# Patient Record
Sex: Female | Born: 1960 | Race: White | Hispanic: No | Smoking: Current some day smoker
Health system: Southern US, Community
[De-identification: ages and names within clinical notes are randomized; demographics above are authoritative.]

## PROBLEM LIST (undated history)

## (undated) ENCOUNTER — Emergency Department (HOSPITAL_COMMUNITY): Discharge status: Absent without leave | Payer: Self-pay

## (undated) DIAGNOSIS — F909 Attention-deficit hyperactivity disorder, unspecified type: Secondary | ICD-10-CM

## (undated) DIAGNOSIS — G43909 Migraine, unspecified, not intractable, without status migrainosus: Secondary | ICD-10-CM

## (undated) HISTORY — PX: TUBAL LIGATION: SHX77

---

## 2010-07-28 ENCOUNTER — Emergency Department (HOSPITAL_COMMUNITY)
Admission: EM | Admit: 2010-07-28 | Discharge: 2010-07-28 | Disposition: A | Discharge status: Home / Self Care | Payer: Self-pay | Attending: Emergency Medicine | Admitting: Emergency Medicine

## 2010-07-28 DIAGNOSIS — J029 Acute pharyngitis, unspecified: Secondary | ICD-10-CM | POA: Insufficient documentation

## 2010-07-28 DIAGNOSIS — R599 Enlarged lymph nodes, unspecified: Secondary | ICD-10-CM | POA: Insufficient documentation

## 2010-07-28 DIAGNOSIS — B370 Candidal stomatitis: Secondary | ICD-10-CM | POA: Insufficient documentation

## 2010-07-28 LAB — RAPID STREP SCREEN (MED CTR MEBANE ONLY): Streptococcus, Group A Screen (Direct): NEGATIVE

## 2011-10-23 ENCOUNTER — Emergency Department (HOSPITAL_COMMUNITY): Payer: No Typology Code available for payment source

## 2011-10-23 ENCOUNTER — Encounter (HOSPITAL_COMMUNITY): Payer: Self-pay | Admitting: Emergency Medicine

## 2011-10-23 ENCOUNTER — Inpatient Hospital Stay (HOSPITAL_COMMUNITY)
Admission: EM | Admit: 2011-10-23 | Discharge: 2011-10-26 | DRG: 392 | Disposition: A | Discharge status: Home / Self Care | Payer: No Typology Code available for payment source | Attending: Family Medicine | Admitting: Family Medicine

## 2011-10-23 DIAGNOSIS — N83209 Unspecified ovarian cyst, unspecified side: Secondary | ICD-10-CM | POA: Diagnosis present

## 2011-10-23 DIAGNOSIS — A09 Infectious gastroenteritis and colitis, unspecified: Principal | ICD-10-CM | POA: Diagnosis present

## 2011-10-23 DIAGNOSIS — R51 Headache: Secondary | ICD-10-CM | POA: Diagnosis present

## 2011-10-23 DIAGNOSIS — K529 Noninfective gastroenteritis and colitis, unspecified: Secondary | ICD-10-CM

## 2011-10-23 DIAGNOSIS — R112 Nausea with vomiting, unspecified: Secondary | ICD-10-CM | POA: Diagnosis present

## 2011-10-23 DIAGNOSIS — R1110 Vomiting, unspecified: Secondary | ICD-10-CM

## 2011-10-23 DIAGNOSIS — D62 Acute posthemorrhagic anemia: Secondary | ICD-10-CM | POA: Diagnosis present

## 2011-10-23 DIAGNOSIS — F17200 Nicotine dependence, unspecified, uncomplicated: Secondary | ICD-10-CM | POA: Diagnosis present

## 2011-10-23 DIAGNOSIS — F988 Other specified behavioral and emotional disorders with onset usually occurring in childhood and adolescence: Secondary | ICD-10-CM | POA: Diagnosis present

## 2011-10-23 DIAGNOSIS — A088 Other specified intestinal infections: Secondary | ICD-10-CM | POA: Diagnosis present

## 2011-10-23 DIAGNOSIS — K922 Gastrointestinal hemorrhage, unspecified: Secondary | ICD-10-CM

## 2011-10-23 HISTORY — DX: Migraine, unspecified, not intractable, without status migrainosus: G43.909

## 2011-10-23 LAB — CBC WITH DIFFERENTIAL/PLATELET
Basophils Absolute: 0 10*3/uL (ref 0.0–0.1)
Eosinophils Relative: 0 % (ref 0–5)
Lymphocytes Relative: 20 % (ref 12–46)
Lymphs Abs: 2.1 10*3/uL (ref 0.7–4.0)
MCV: 87.9 fL (ref 78.0–100.0)
Neutro Abs: 7.8 10*3/uL — ABNORMAL HIGH (ref 1.7–7.7)
Neutrophils Relative %: 73 % (ref 43–77)
Platelets: 368 10*3/uL (ref 150–400)
RBC: 5.46 MIL/uL — ABNORMAL HIGH (ref 3.87–5.11)
RDW: 14 % (ref 11.5–15.5)
WBC: 10.7 10*3/uL — ABNORMAL HIGH (ref 4.0–10.5)

## 2011-10-23 LAB — COMPREHENSIVE METABOLIC PANEL
ALT: 25 U/L (ref 0–35)
AST: 20 U/L (ref 0–37)
Alkaline Phosphatase: 75 U/L (ref 39–117)
CO2: 26 mEq/L (ref 19–32)
Chloride: 97 mEq/L (ref 96–112)
GFR calc Af Amer: >90 mL/min (ref >90)
GFR calc non Af Amer: >90 mL/min (ref >90)
Glucose, Bld: 100 mg/dL — ABNORMAL HIGH (ref 70–99)
Potassium: 4.2 mEq/L (ref 3.5–5.1)
Sodium: 137 mEq/L (ref 135–145)
Total Bilirubin: 0.3 mg/dL (ref 0.3–1.2)

## 2011-10-23 LAB — APTT: aPTT: 31 seconds (ref 24–37)

## 2011-10-23 LAB — TYPE AND SCREEN
ABO/RH(D): A POS
Antibody Screen: NEGATIVE

## 2011-10-23 MED ORDER — IOHEXOL 300 MG/ML  SOLN
100.0000 mL | Freq: Once | INTRAMUSCULAR | Status: AC | PRN
  Administered 2011-10-23: 100 mL via INTRAVENOUS

## 2011-10-23 MED ORDER — NICOTINE 21 MG/24HR TD PT24
21.0000 mg | MEDICATED_PATCH | Freq: Once | TRANSDERMAL | Status: AC
  Administered 2011-10-23: 21 mg via TRANSDERMAL
  Filled 2011-10-23: qty 1

## 2011-10-23 MED ORDER — HYDROMORPHONE HCL PF 1 MG/ML IJ SOLN
1.0000 mg | Freq: Once | INTRAMUSCULAR | Status: AC
  Administered 2011-10-23: 1 mg via INTRAVENOUS
  Filled 2011-10-23 (×2): qty 1

## 2011-10-23 MED ORDER — SODIUM CHLORIDE 0.9 % IV BOLUS (SEPSIS)
1000.0000 mL | Freq: Once | INTRAVENOUS | Status: AC
  Administered 2011-10-23: 1000 mL via INTRAVENOUS

## 2011-10-23 MED ORDER — ONDANSETRON HCL 4 MG/2ML IJ SOLN
4.0000 mg | Freq: Once | INTRAMUSCULAR | Status: AC
  Administered 2011-10-23: 4 mg via INTRAVENOUS
  Filled 2011-10-23 (×2): qty 2

## 2011-10-23 MED ORDER — HYDROMORPHONE HCL PF 1 MG/ML IJ SOLN
0.5000 mg | INTRAMUSCULAR | Status: DC | PRN
  Administered 2011-10-23: 0.5 mg via INTRAVENOUS
  Filled 2011-10-23: qty 1

## 2011-10-23 MED ORDER — HYDROMORPHONE HCL PF 1 MG/ML IJ SOLN
1.0000 mg | Freq: Once | INTRAMUSCULAR | Status: AC
  Administered 2011-10-23: 1 mg via INTRAVENOUS
  Filled 2011-10-23: qty 1

## 2011-10-23 MED ORDER — SODIUM CHLORIDE 0.9 % IJ SOLN
3.0000 mL | Freq: Two times a day (BID) | INTRAMUSCULAR | Status: DC
  Administered 2011-10-23: 3 mL via INTRAVENOUS

## 2011-10-23 MED ORDER — LORAZEPAM 2 MG/ML IJ SOLN
1.0000 mg | Freq: Once | INTRAMUSCULAR | Status: AC
  Administered 2011-10-23: 1 mg via INTRAVENOUS
  Filled 2011-10-23: qty 1

## 2011-10-23 MED ORDER — SODIUM CHLORIDE 0.9 % IV SOLN
INTRAVENOUS | Status: DC
  Administered 2011-10-23: 18:00:00 via INTRAVENOUS

## 2011-10-23 MED ORDER — SODIUM CHLORIDE 0.9 % IV SOLN
INTRAVENOUS | Status: DC
  Administered 2011-10-23 – 2011-10-24 (×2): via INTRAVENOUS
  Administered 2011-10-24: 125 mL/h via INTRAVENOUS
  Administered 2011-10-25: 19:00:00 via INTRAVENOUS

## 2011-10-23 NOTE — ED Notes (Signed)
Pt states she started having a "churning" feeling in her stomach and sharp pains and she had to go to the bathroom that started today. She reports having diarrhea at 7am and around 9am she began to see bright red blood in the toilet. Pt reports nausea and vomiting x1.

## 2011-10-23 NOTE — ED Notes (Signed)
Pt states she is still in pain and requesting more pain medication. Rates pain a 7/10

## 2011-10-23 NOTE — ED Notes (Signed)
Report given to [facility] on 5E Room 1516.

## 2011-10-23 NOTE — ED Provider Notes (Signed)
History     CSN: [id number]  Arrival date & time 10/23/11  1627   First MD Initiated Contact with Patient 10/23/11 1653      Chief Complaint  Patient presents with  . Rectal Bleeding    (Consider location/radiation/quality/duration/timing/severity/associated sxs/prior treatment) Patient is a 51 y.o. female presenting with hematochezia. The history is provided by the patient.  Rectal Bleeding    bright red blood per rectum x1 day with associated dizziness with standing. Note some abdominal cramping without fever or vomiting. No prior history of same. Denies any history of colitis in her family. No recent travel history or antibiotic use. No treatment used prior to arrival and nothing makes her symptoms worse.  History reviewed. No pertinent past medical history.  Past Surgical History  Procedure Date  . Tubal ligation     71yrs ago    Family History  Problem Relation Age of Onset  . Cancer Mother   . Cancer Father     History  Substance Use Topics  . Smoking status: Current Every Day Smoker -- 1.0 packs/day for 15 years    Types: Cigarettes  . Smokeless tobacco: Never Used  . Alcohol Use: Yes     2 beers a week    OB History    Grav Para Term Preterm Abortions TAB SAB Ect Mult Living   2 2 2       2       Review of Systems  Gastrointestinal: Positive for hematochezia.  All other systems reviewed and are negative.    Allergies  Penicillins  Home Medications   Current Outpatient Rx  Name Route Sig Dispense Refill  . AMPHETAMINE-DEXTROAMPHETAMINE 20 MG PO TABS Oral Take 20 mg by mouth daily.    . ASPIRIN-ACETAMINOPHEN-CAFFEINE 250-250-65 MG PO TABS Oral Take 1 tablet by mouth every 6 (six) hours as needed. headache      BP 121/86  Pulse 124  Temp 97.5 F (36.4 C) (Oral)  Resp 20  SpO2 97%  Physical Exam  Nursing note and vitals reviewed. Constitutional: She is oriented to person, place, and time. She appears well-developed and well-nourished.   Non-toxic appearance. No distress.  HENT:  Head: Normocephalic and atraumatic.  Eyes: Conjunctivae normal, EOM and lids are normal. Pupils are equal, round, and reactive to light.  Neck: Normal range of motion. Neck supple. No tracheal deviation present. No mass present.  Cardiovascular: Regular rhythm and normal heart sounds.  Tachycardia present.  Exam reveals no gallop.   No murmur heard. Pulmonary/Chest: Effort normal and breath sounds normal. No stridor. No respiratory distress. She has no decreased breath sounds. She has no wheezes. She has no rhonchi. She has no rales.  Abdominal: Soft. Normal appearance and bowel sounds are normal. She exhibits no distension. There is no tenderness. There is no rebound and no CVA tenderness.  Genitourinary:       Gross blood noted  Musculoskeletal: Normal range of motion. She exhibits no edema and no tenderness.  Neurological: She is alert and oriented to person, place, and time. She has normal strength. No cranial nerve deficit or sensory deficit. GCS eye subscore is 4. GCS verbal subscore is 5. GCS motor subscore is 6.  Skin: Skin is warm and dry. No abrasion and no rash noted.  Psychiatric: She has a normal mood and affect. Her speech is normal and behavior is normal.    ED Course  Procedures (including critical care time)   Labs Reviewed  CBC WITH DIFFERENTIAL  COMPREHENSIVE METABOLIC PANEL  PROTIME-INR  APTT  TYPE AND SCREEN   No results found.   No diagnosis found.    MDM  Patient to be admitted for workup of her gastrointestinal bleeding. Given IV fluids for her tachycardia        Toy Baker, MD 10/23/11 2024

## 2011-10-23 NOTE — ED Notes (Signed)
Pt requesting pain medicine for abdominal pain. Rates pain a 8/10.

## 2011-10-23 NOTE — ED Notes (Signed)
Contrast finished. Pt ready for CT.

## 2011-10-23 NOTE — H&P (Addendum)
[facility] History and Physical  Shannon Farley AOZ:[id number] DOB: Feb 14, 1960 DOA: 10/23/2011  Referring physician: ED PCP: No primary provider on file.  Specialists: None  Chief Complaint: BRBPR  HPI: Shannon Farley is a 51 y.o. female who presents with 1 day h/o LLQ abdominal pain and BRBPR.  Some abdominal cramping, no fever, no vomiting, no recent sick contacts or colitis in family.  No recent travel nor ABx use, nothing tried PTA and nothing makes it worse.  In the ED, patient found to have HGB 16.5, BRBPR was confirmed via exam, CT abdomen showed inflammation of the splenic flexure.  Hospitalist was asked to admit the patient.  Review of Systems: 12 systems reviewed and otherwise negative.  History reviewed. No pertinent past medical history. Past Surgical History  Procedure Date  . Tubal ligation     50yrs ago   Social History:  reports that she has been smoking Cigarettes.  She has a 15 pack-year smoking history. She has never used smokeless tobacco. She reports that she drinks alcohol. She reports that she does not use illicit drugs. Patient lives at home and performs all ADLs.  Allergies  Allergen Reactions  . Penicillins Swelling    Family History  Problem Relation Age of Onset  . Cancer Mother   . Cancer Father   Multiple family members have various forms of cancer, many at early ages, multiple aunts with breast cancer, uncle she thinks may have had colon cancer.  Parents with lung and pancreatic cancer.  One family member did have a history of Crohn's disease.  Prior to Admission medications   Medication Sig Start Date End Date Taking? Authorizing Provider  amphetamine-dextroamphetamine (ADDERALL) 20 MG tablet Take 20 mg by mouth daily.   Yes Historical Provider, MD  aspirin-acetaminophen-caffeine (EXCEDRIN MIGRAINE) [id number] MG per tablet Take 1 tablet by mouth every 6 (six) hours as needed. headache   Yes Historical Provider, MD   Physical Exam: Filed  Vitals:   10/23/11 1630 10/23/11 1820  BP: 121/86 147/109  Pulse: 124 114  Temp: 97.5 F (36.4 C)   TempSrc: Oral   Resp: 20 18  SpO2: 97% 98%     General:  NAD, resting comfortably in hospital bed, sedated somewhat on meds  Eyes: PEERLA EOMI  ENT: mucous membranes moist  Neck: supple w/o JVD  Cardiovascular: RRR w/o MRG  Respiratory: CTA B  Abdomen: Soft, mild TTP in LLQ, no rebound, no guarding, no organomegally, no obvious masses  Skin: no rash nor lesion  Musculoskeletal: MAE, full ROM all 4 extremities  Psychiatric: Somewhat anxious tone and affect, understandable given patients extensive family history of cancer and her concern that (however unlikely) this could be malignancy  Neurologic: drowsy but able to wake up and be AAOx3, otherwise grossly non-focal  Labs on Admission:  Basic Metabolic Panel:  Lab 10/23/11 1324  NA 137  K 4.2  CL 97  CO2 26  GLUCOSE 100*  BUN 7  CREATININE 0.65  CALCIUM 10.9*  MG --  PHOS --   Liver Function Tests:  Lab 10/23/11 1655  AST 20  ALT 25  ALKPHOS 75  BILITOT 0.3  PROT 7.9  ALBUMIN 4.4   No results found for this basename: LIPASE:5,AMYLASE:5 in the last 168 hours No results found for this basename: AMMONIA:5 in the last 168 hours CBC:  Lab 10/23/11 1655  WBC 10.7*  NEUTROABS 7.8*  HGB 16.5*  HCT 48.0*  MCV 87.9  PLT 368   Cardiac Enzymes: No  results found for this basename: CKTOTAL:5,CKMB:5,CKMBINDEX:5,TROPONINI:5 in the last 168 hours  BNP (last 3 results) No results found for this basename: PROBNP:3 in the last 8760 hours CBG: No results found for this basename: GLUCAP:5 in the last 168 hours  Radiological Exams on Admission: Ct Abdomen Pelvis W Contrast  10/23/2011  *RADIOLOGY REPORT*  Clinical Data: Rectal bleeding with abdominal cramping.  CT ABDOMEN AND PELVIS WITH CONTRAST  Technique:  Multidetector CT imaging of the abdomen and pelvis was performed following the standard protocol during  bolus administration of intravenous contrast.  Contrast:  100 ml Omnipaque-300  Comparison: None.  Findings: Liver and spleen are normal in appearance.  The stomach, duodenum, pancreas, gallbladder, adrenal glands, and right kidney are normal.  There is some cortical scarring laterally in the upper pole of the left kidney.  No abdominal aortic aneurysm.  No free fluid or lymphadenopathy in the abdomen.  There is circumferential edematous wall thickening in the splenic flexure which extends about halfway down the descending colon, but is mainly limited to the splenic flexure area.  The portal vein, splenic vein, and superior mesenteric vein are patent.  The celiac axis and superior mesenteric artery appear widely patent to their proximal segments.  Imaging through the pelvis shows no free intraperitoneal fluid.  No pelvic sidewall lymphadenopathy.  The bladder is normal.  Uterus is unremarkable.  2.7 cm cystic structure is identified in the right ovary.  1.3 cm oval fatty lesion is seen along the anteroinferior aspect of the left ovary.  This is probably a small dermoid.  The terminal ileum and the appendix are normal.  Bone windows reveal no worrisome lytic or sclerotic osseous lesions.  IMPRESSION: Mild circumferential edematous wall thickening in the splenic flexure.  Differential considerations would include inflammatory, infectious, and ischemic colitis.  2.7 cm cystic lesion in the right ovary is likely a dominant follicle.  There is a 13 mm fatty lesion in the region of the left ovary which may represent a small left ovarian dermoid.  Follow-up pelvic ultrasound could be performed in a non emergent fashion to further characterize.   Original Report Authenticated By: ERIC A. MANSELL, M.D.     EKG: No ekg obtained  Assessment/Plan Principal Problem:  *GI bleed Active Problems:  Ovarian cyst   1. GI bleed - likely lower eminating from the splenic flexure as seen on CT scan of her abdomen, given lack  of other sepsis or SIRS findings or diarrhea symptoms i doubt infectious etiology.  Ischemic and inflammatory are possibilities with inflammatory being the most likely diagnosis, neoplastic is possible but less likely.  Regardless we will evaluate for all of these with a GI consult for evaluation and likely colonoscopy. 2. 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 [facility] Pager [phone]  If 7PM-7AM, please contact night-coverage [email or web address] Password [email or web address] 10/23/2011, 9:26 PM

## 2011-10-23 NOTE — ED Notes (Signed)
Pt c/o bright red rectal bleeding since this morning. Pt states she has had multiple episodes of rectal bleeding since this morning.  Pt c/o lower abd pain and nausea. Pt denies hx of hemmorhoids. Pt also c/o dizziness. Pt a/o. Skin pale, warm, dry.

## 2011-10-24 ENCOUNTER — Encounter (HOSPITAL_COMMUNITY): Payer: Self-pay | Admitting: Gastroenterology

## 2011-10-24 DIAGNOSIS — K529 Noninfective gastroenteritis and colitis, unspecified: Secondary | ICD-10-CM

## 2011-10-24 DIAGNOSIS — R1110 Vomiting, unspecified: Secondary | ICD-10-CM

## 2011-10-24 DIAGNOSIS — K5289 Other specified noninfective gastroenteritis and colitis: Secondary | ICD-10-CM

## 2011-10-24 DIAGNOSIS — K922 Gastrointestinal hemorrhage, unspecified: Secondary | ICD-10-CM

## 2011-10-24 LAB — BASIC METABOLIC PANEL
BUN: 5 mg/dL — ABNORMAL LOW (ref 6–23)
CO2: 26 mEq/L (ref 19–32)
Chloride: 105 mEq/L (ref 96–112)
Glucose, Bld: 95 mg/dL (ref 70–99)
Potassium: 3.8 mEq/L (ref 3.5–5.1)

## 2011-10-24 MED ORDER — PROMETHAZINE HCL 25 MG/ML IJ SOLN
12.5000 mg | INTRAMUSCULAR | Status: DC | PRN
  Administered 2011-10-24 – 2011-10-26 (×5): 12.5 mg via INTRAVENOUS
  Filled 2011-10-24 (×5): qty 1

## 2011-10-24 MED ORDER — NICOTINE 21 MG/24HR TD PT24
21.0000 mg | MEDICATED_PATCH | Freq: Once | TRANSDERMAL | Status: DC
  Filled 2011-10-24: qty 1

## 2011-10-24 MED ORDER — NICOTINE 21 MG/24HR TD PT24
21.0000 mg | MEDICATED_PATCH | Freq: Once | TRANSDERMAL | Status: AC
  Administered 2011-10-24: 21 mg via TRANSDERMAL
  Filled 2011-10-24: qty 1

## 2011-10-24 MED ORDER — METRONIDAZOLE IN NACL 5-0.79 MG/ML-% IV SOLN
500.0000 mg | Freq: Three times a day (TID) | INTRAVENOUS | Status: DC
  Administered 2011-10-24 – 2011-10-26 (×7): 500 mg via INTRAVENOUS
  Filled 2011-10-24 (×8): qty 100

## 2011-10-24 MED ORDER — CIPROFLOXACIN IN D5W 400 MG/200ML IV SOLN
400.0000 mg | Freq: Two times a day (BID) | INTRAVENOUS | Status: DC
  Administered 2011-10-24 – 2011-10-26 (×5): 400 mg via INTRAVENOUS
  Filled 2011-10-24 (×6): qty 200

## 2011-10-24 MED ORDER — HYDROMORPHONE HCL PF 1 MG/ML IJ SOLN
1.0000 mg | Freq: Once | INTRAMUSCULAR | Status: AC
  Administered 2011-10-24: 1 mg via INTRAVENOUS
  Filled 2011-10-24: qty 1

## 2011-10-24 MED ORDER — HYDROMORPHONE HCL PF 1 MG/ML IJ SOLN
0.5000 mg | INTRAMUSCULAR | Status: DC | PRN
  Administered 2011-10-24 (×4): 0.5 mg via INTRAVENOUS
  Filled 2011-10-24 (×5): qty 1

## 2011-10-24 MED ORDER — ACETAMINOPHEN 325 MG PO TABS
650.0000 mg | ORAL_TABLET | Freq: Four times a day (QID) | ORAL | Status: DC | PRN
  Filled 2011-10-24: qty 2

## 2011-10-24 MED ORDER — AMPHETAMINE-DEXTROAMPHETAMINE 20 MG PO TABS
20.0000 mg | ORAL_TABLET | Freq: Every day | ORAL | Status: DC
  Administered 2011-10-24 – 2011-10-25 (×2): 20 mg via ORAL
  Filled 2011-10-24 (×2): qty 1

## 2011-10-24 MED ORDER — ASPIRIN-ACETAMINOPHEN-CAFFEINE 250-250-65 MG PO TABS
1.0000 | ORAL_TABLET | Freq: Four times a day (QID) | ORAL | Status: DC | PRN
  Administered 2011-10-24: 1 via ORAL
  Administered 2011-10-24 – 2011-10-25 (×3): 2 via ORAL
  Filled 2011-10-24 (×4): qty 2

## 2011-10-24 NOTE — Consult Note (Signed)
Referring Provider: No ref. provider found Primary Care Physician:  No primary provider on file. Primary Gastroenterologist:  None  Reason for Consultation:  GIB  HPI: Shannon Farley is a 51 y.o. female with minimal PMH who presented to [facility] ED on 10/16 with complaints of sudden onset lower abdominal pain and rectal bleeding.  CT scan revealed mild circumferential edematous wall thickening in the splenic flexure.  Patient was admitted for further evaluation and GI was consulted.   Patient awoke the morning of 10/16 with cramping abdominal pain and felt like she had to move her bowels.  Had a couple of watery bowel movements, but did not look at them initially.  At short time later she noticed blood in the toilet bowl, a couple of tablespoons.  This occurred several times throughout the time, about every 30 to 60 minutes.  She began to feel weak and drained as if she might pass out, so came to the ED.  Hgb was initially 16.5 grams and this AM is down to 13.6 grams.  No elevated WBC count or documented fevers so antibiotics have not been started.  Says that she had some nausea and one episode of vomiting yesterday morning.  Had only a couple bouts of passing blood since presenting to the ED, becoming much less in frequency.  Last was early this AM.  Usually has regular bowel movements with no significant constipation or diarrhea.  Never had similar symptoms previously; has never seen blood in the past.  No family history of colon cancer or IBD to her knowledge.  Other than medications listed, she takes occasional OTC NSAID or Goodie, but on very rare occasion.   Past Medical History  Diagnosis Date  . Migraines     Past Surgical History  Procedure Date  . Tubal ligation     22yrs ago    Prior to Admission medications   Medication Sig Start Date End Date Taking? Authorizing Provider  amphetamine-dextroamphetamine (ADDERALL) 20 MG tablet Take 20 mg by mouth daily.   Yes Historical Provider, MD    aspirin-acetaminophen-caffeine (EXCEDRIN MIGRAINE) [id number] MG per tablet Take 1 tablet by mouth every 6 (six) hours as needed. headache   Yes Historical Provider, MD    Current Facility-Administered Medications  Medication Dose Route Frequency Provider Last Rate Last Dose  . 0.9 %  sodium chloride infusion   Intravenous Continuous Hillary Bow, DO 125 mL/hr at 10/24/11 0705    . HYDROmorphone (DILAUDID) injection 0.5 mg  0.5 mg Intravenous Q3H PRN Caroline More, NP   0.5 mg at 10/24/11 9629  . HYDROmorphone (DILAUDID) injection 1 mg  1 mg Intravenous Once Toy Baker, MD   1 mg at 10/23/11 1839  . HYDROmorphone (DILAUDID) injection 1 mg  1 mg Intravenous Once Toy Baker, MD   1 mg at 10/23/11 2054  . HYDROmorphone (DILAUDID) injection 1 mg  1 mg Intravenous Once Caroline More, NP   1 mg at 10/24/11 0206  . iohexol (OMNIPAQUE) 300 MG/ML solution 100 mL  100 mL Intravenous Once PRN Medication Radiologist, MD   100 mL at 10/23/11 1938  . LORazepam (ATIVAN) injection 1 mg  1 mg Intravenous Once Toy Baker, MD   1 mg at 10/23/11 1840  . nicotine (NICODERM CQ - dosed in mg/24 hours) patch 21 mg  21 mg Transdermal Once Toy Baker, MD   21 mg at 10/23/11 2105  . ondansetron (ZOFRAN) injection 4 mg  4 mg Intravenous  Once Hurman Horn, MD   4 mg at 10/23/11 1810  . ondansetron (ZOFRAN) injection 4 mg  4 mg Intravenous Once Toy Baker, MD   4 mg at 10/23/11 2055  . sodium chloride 0.9 % bolus 1,000 mL  1,000 mL Intravenous Once Toy Baker, MD   1,000 mL at 10/23/11 1735  . sodium chloride 0.9 % injection 3 mL  3 mL Intravenous Q12H Hillary Bow, DO   3 mL at 10/23/11 2255  . DISCONTD: 0.9 %  sodium chloride infusion   Intravenous Continuous Toy Baker, MD 125 mL/hr at 10/23/11 1812    . DISCONTD: HYDROmorphone (DILAUDID) injection 0.5 mg  0.5 mg Intravenous Q4H PRN Hillary Bow, DO   0.5 mg at 10/23/11 2319    Allergies as of 10/23/2011 - Review Complete  10/23/2011  Allergen Reaction Noted  . Penicillins Swelling 10/23/2011    Family History  Problem Relation Age of Onset  . Cancer Mother 71    pancreatic cancer  . Cancer Father 62    lung cancer    History   Social History  . Marital Status: Divorced    Spouse Name: N/A    Number of Children: N/A  . Years of Education: N/A   Occupational History  . Not on file.   Social History Main Topics  . Smoking status: Current Every Day Smoker -- 1.0 packs/day for 15 years    Types: Cigarettes  . Smokeless tobacco: Never Used  . Alcohol Use: Yes     2 beers a week  . Drug Use: No  . Sexually Active:    Other Topics Concern  . Not on file   Social History Narrative  . No narrative on file    Review of Systems: Ten point ROS O/W negative except as mentioned in HPI.  Physical Exam: Vital signs in last 24 hours: Temp:  [97.3 F (36.3 C)-97.8 F (36.6 C)] 97.8 F (36.6 C) (10/17 0702) Pulse Rate:  [96-124] 104  (10/17 0702) Resp:  [18-20] 18  (10/17 0702) BP: (111-147)/(76-109) 116/82 mmHg (10/17 0702) SpO2:  [92 %-98 %] 92 % (10/17 0702) Weight:  [162 lb 4.1 oz (73.6 kg)] 162 lb 4.1 oz (73.6 kg) (10/16 2301) Last BM Date: 10/23/11 General:   Alert,  Well-developed, well-nourished, pleasant and cooperative in NAD. Head:  Normocephalic and atraumatic. Eyes:  Sclera clear, no icterus.  Conjunctiva pink. Ears:  Normal auditory acuity. Mouth:  No deformity or lesions.   Lungs:  Clear throughout to auscultation.  No wheezes, crackles, or rhonchi.  Heart:  Regular rate and rhythm; no murmurs, clicks, rubs,  or gallops. Abdomen:  Soft, non-distended.  BS present.  Mild lower abdominal TTP without R/R/G. Rectal:  Deferred.  Msk:  Symmetrical without gross deformities. Pulses:  Normal pulses noted. Extremities:  Without clubbing or edema. Neurologic:  Alert and  oriented x4;  grossly normal neurologically. Skin:  Intact without significant lesions or rashes. Psych:  Alert  and cooperative. Normal mood and affect.  Intake/Output from previous day: 10/16 0701 - 10/17 0700 In: 1585.8 [P.O.:240; I.V.:1345.8] Out: 450 [Urine:450]  Lab Results:  Basename 10/24/11 0435 10/23/11 2143 10/23/11 1655  WBC -- -- 10.7*  HGB 13.6 13.7 16.5*  HCT -- -- 48.0*  PLT -- -- 368   BMET  Basename 10/24/11 0435 10/23/11 1655  NA 138 137  K 3.8 4.2  CL 105 97  CO2 26 26  GLUCOSE 95 100*  BUN  5* 7  CREATININE 0.77 0.65  CALCIUM 9.1 10.9*   LFT  Basename 10/23/11 1655  PROT 7.9  ALBUMIN 4.4  AST 20  ALT 25  ALKPHOS 75  BILITOT 0.3  BILIDIR --  IBILI --   PT/INR  Basename 10/23/11 1655  LABPROT 12.1  INR 0.90    Studies/Results: Ct Abdomen Pelvis W Contrast  10/23/2011  *RADIOLOGY REPORT*  Clinical Data: Rectal bleeding with abdominal cramping.  CT ABDOMEN AND PELVIS WITH CONTRAST  Technique:  Multidetector CT imaging of the abdomen and pelvis was performed following the standard protocol during bolus administration of intravenous contrast.  Contrast:  100 ml Omnipaque-300  Comparison: None.  Findings: Liver and spleen are normal in appearance.  The stomach, duodenum, pancreas, gallbladder, adrenal glands, and right kidney are normal.  There is some cortical scarring laterally in the upper pole of the left kidney.  No abdominal aortic aneurysm.  No free fluid or lymphadenopathy in the abdomen.  There is circumferential edematous wall thickening in the splenic flexure which extends about halfway down the descending colon, but is mainly limited to the splenic flexure area.  The portal vein, splenic vein, and superior mesenteric vein are patent.  The celiac axis and superior mesenteric artery appear widely patent to their proximal segments.  Imaging through the pelvis shows no free intraperitoneal fluid.  No pelvic sidewall lymphadenopathy.  The bladder is normal.  Uterus is unremarkable.  2.7 cm cystic structure is identified in the right ovary.  1.3 cm oval fatty  lesion is seen along the anteroinferior aspect of the left ovary.  This is probably a small dermoid.  The terminal ileum and the appendix are normal.  Bone windows reveal no worrisome lytic or sclerotic osseous lesions.  IMPRESSION: Mild circumferential edematous wall thickening in the splenic flexure.  Differential considerations would include inflammatory, infectious, and ischemic colitis.  2.7 cm cystic lesion in the right ovary is likely a dominant follicle.  There is a 13 mm fatty lesion in the region of the left ovary which may represent a small left ovarian dermoid.  Follow-up pelvic ultrasound could be performed in a non emergent fashion to further characterize.   Original Report Authenticated By: ERIC A. MANSELL, M.D.     IMPRESSION:  -Acute onset LGIB with abdominal pain.  CT scan showing mild wall thickening at splenic flexure.  This presentation sounds either infectious or ischemic.  She does not really have any risk factors for ischemia, however, this edema in the "watershed area" at the splenic flexure is a common are affected by ischemia.  Either way these are both usually acute and self-limited; already seems to be improving.    PLAN: -Patient will need colonoscopy either inpatient during this stay or outpatient in several weeks when she has fully recovered.  Dr. Christella Hartigan prefers to perform as outpatient unless patient continues to bleed or patient prefers inpatient evaluation.  He will discuss this again with her later today. -Otherwise, continue to monitor for now.    ZEHR, JESSICA D.  10/24/2011, 8:59 AM  Pager number [phone]   ________________________________________________________________________  [facility] MD note:  I personally examined the patient, reviewed the data and agree with the assessment and plan described above.  Pretty acute onset abd cramping, bloody diarrhea, also nausea/vomiting.  CT shows splenic flexure inflammation.  DDx: inflammatory, infectious, ischemic  (doubt cancer).  Feels more like ischemia however she's become pretty nauseas/vomiting and that is not usually such a predominant symptom with ischemia.  For now, supportive care with IV fluids, antiemetics and I am going to start IV abx as well.  If she clinically improves in next 2-3 days, then OK to go home, follow up with me in 3-4 weeks to schedule colonoscopy.  IF she does not improve, then she will probably need colonoscopy sooner.   Rob Bunting, MD [facility] Pager [phone]

## 2011-10-24 NOTE — Progress Notes (Signed)
PROGRESS NOTE  Tearsa Kowalewski WUJ:[id number] DOB: 1960-09-15 DOA: 10/23/2011 PCP: No primary provider on file.  Brief narrative: 51 yr old CF admitted with N/V and bloody diarrhoea-she states that this started abruptly on the morning of 10/23/2011. She developed some nausea but then progressed to have sudden onset profuse watery diarrhea. She does not know exactly what color the diarrhea was initially, and at 9:30 that morning, she developed bright red blood passage per rectum. She stated that she has lower abdominal pain but did not really have much vomiting other than one episode at around 10 in the morning 10/23/2011.   Past medical history-As per Problem list Chart reviewed as below- No significant past medical history States she's been diagnosed with migraine headaches-Excedrin works the best for her States she has ADD-recently placed back on Adderall Moved from [place] one year ago-her family care physician is Dr. Madelin Rear  Consultants:  Gastroenterology  Procedures:  CT scan 10/23/11 concerning for either it she may colitis with mild edema versus infectious process  Antibiotics:  Ciprofloxacin 10/17  Flagyl 10/17   Subjective  States she has a headache-stage abdominal pain is about 4 and 10-this is relieved by Dilaudid. She had 2 dark stools overnight, 1 showed bright red blood per patient's report She does not have any nausea and has been sipping fluids She denies any shortness of breath and chest pain   Objective    Interim History: Chart reviewed  Telemetry: Sinus tachycardia into the low 100  Objective: Filed Vitals:   10/23/11 1630 10/23/11 1820 10/23/11 2301 10/24/11 0702  BP: 121/86 147/109 111/76 116/82  Pulse: 124 114 96 104  Temp: 97.5 F (36.4 C)  97.3 F (36.3 C) 97.8 F (36.6 C)  TempSrc: Oral  Oral Oral  Resp: 20 18 18 18   Height:   5\' 5"  (1.651 m)   Weight:   73.6 kg (162 lb 4.1 oz)   SpO2: 97% 98% 94% 92%    Intake/Output Summary  (Last 24 hours) at 10/24/11 1022 Last data filed at 10/24/11 0458  Gross per 24 hour  Intake 1585.83 ml  Output    450 ml  Net 1135.83 ml    Exam:  General: Alert oriented pleasant Caucasian female looking about stated age, no pallor no icterus with no throat injection Cardiovascular: S1-S2 no murmur rub or gallop tachycardic Respiratory: Clinically clear no added sound Abdomen: Bowel sounds positive, bilateral lower quadrant tenderness, no rebound or guarding Skin no swelling Neuro motor is grossly intact  Data Reviewed: Basic Metabolic Panel:  Lab 10/24/11 9562 10/23/11 1655  NA 138 137  K 3.8 4.2  CL 105 97  CO2 26 26  GLUCOSE 95 100*  BUN 5* 7  CREATININE 0.77 0.65  CALCIUM 9.1 10.9*  MG -- --  PHOS -- --   Liver Function Tests:  Lab 10/23/11 1655  AST 20  ALT 25  ALKPHOS 75  BILITOT 0.3  PROT 7.9  ALBUMIN 4.4   No results found for this basename: LIPASE:5,AMYLASE:5 in the last 168 hours No results found for this basename: AMMONIA:5 in the last 168 hours CBC:  Lab 10/24/11 0435 10/23/11 2143 10/23/11 1655  WBC -- -- 10.7*  NEUTROABS -- -- 7.8*  HGB 13.6 13.7 16.5*  HCT -- -- 48.0*  MCV -- -- 87.9  PLT -- -- 368   Cardiac Enzymes: No results found for this basename: CKTOTAL:5,CKMB:5,CKMBINDEX:5,TROPONINI:5 in the last 168 hours BNP: No components found with this basename: POCBNP:5 CBG: No  results found for this basename: GLUCAP:5 in the last 168 hours  No results found for this or any previous visit (from the past 240 hour(s)).   Studies:              All Imaging reviewed and is as per above notation   Scheduled Meds:   . HYDROmorphone  1 mg Intravenous Once  . HYDROmorphone  1 mg Intravenous Once  . HYDROmorphone  1 mg Intravenous Once  . LORazepam  1 mg Intravenous Once  . nicotine  21 mg Transdermal Once  . ondansetron (ZOFRAN) IV  4 mg Intravenous Once  . ondansetron (ZOFRAN) IV  4 mg Intravenous Once  . sodium chloride  1,000 mL  Intravenous Once  . sodium chloride  3 mL Intravenous Q12H   Continuous Infusions:   . sodium chloride 125 mL/hr at 10/24/11 0705  . DISCONTD: sodium chloride 125 mL/hr at 10/23/11 1812     Assessment/Plan: 1. Abdominal pain-probable viral gastroenteritis versus colitis-patient does not present with any specific risk factors for it she may call and. On closer questioning patient states that she bought 2 steaks on sale at [employer] and had been the night before the symptoms started-am inclined to think that this is potentially related to contaminated meat-she states she's been eating a lot of chicken as well recently. She does not have any antibiotic exposure, therefore it would be very low yield to get a C. difficile PCR on her. If her diarrhea persists I will go ahead and order stool cultures 2. Anemia-multifactorial-dilutional + secondary to GI losses-monitor CBC in the morning-get orthostatics 3. Headache-we'll start Excedrin Migraine-am not inclined to think that this for gastric issue and will still carefully watch her blood count as well as symptomatology as aspirin can cause gastric irritation 4. Tobacco abuse-patient on nicotine patch 5. ADD-restart Adderall 20 mg daily  Code Status: Full code Family Communication: None at bedside Disposition Plan: Inpatient   Pleas Koch, MD  [facility] Pager [phone] 10/24/2011, 10:22 AM    LOS: 1 day

## 2011-10-25 LAB — CBC
HCT: 40.6 % (ref 36.0–46.0)
Hemoglobin: 13.4 g/dL (ref 12.0–15.0)
MCV: 88.6 fL (ref 78.0–100.0)
RBC: 4.58 MIL/uL (ref 3.87–5.11)
WBC: 7.7 10*3/uL (ref 4.0–10.5)

## 2011-10-25 MED ORDER — METHOCARBAMOL 500 MG PO TABS
500.0000 mg | ORAL_TABLET | Freq: Once | ORAL | Status: AC
  Administered 2011-10-25: 500 mg via ORAL
  Filled 2011-10-25: qty 1

## 2011-10-25 MED ORDER — FLUTICASONE PROPIONATE 50 MCG/ACT NA SUSP
1.0000 | Freq: Every day | NASAL | Status: DC
  Administered 2011-10-25 – 2011-10-26 (×2): 1 via NASAL
  Filled 2011-10-25: qty 16

## 2011-10-25 MED ORDER — MORPHINE SULFATE 2 MG/ML IJ SOLN
2.0000 mg | INTRAMUSCULAR | Status: DC | PRN
  Administered 2011-10-25 – 2011-10-26 (×3): 2 mg via INTRAVENOUS
  Filled 2011-10-25 (×3): qty 1

## 2011-10-25 MED ORDER — LORAZEPAM 0.5 MG PO TABS
0.5000 mg | ORAL_TABLET | Freq: Once | ORAL | Status: AC
  Administered 2011-10-25: 0.5 mg via ORAL
  Filled 2011-10-25: qty 1

## 2011-10-25 MED ORDER — NICOTINE 21 MG/24HR TD PT24
21.0000 mg | MEDICATED_PATCH | Freq: Every day | TRANSDERMAL | Status: DC
  Administered 2011-10-25 – 2011-10-26 (×2): 21 mg via TRANSDERMAL
  Filled 2011-10-25 (×2): qty 1

## 2011-10-25 MED ORDER — OXYCODONE-ACETAMINOPHEN 5-325 MG PO TABS
1.0000 | ORAL_TABLET | ORAL | Status: DC | PRN
  Administered 2011-10-25 (×3): 1 via ORAL
  Filled 2011-10-25 (×3): qty 1

## 2011-10-25 MED ORDER — ONDANSETRON HCL 4 MG/2ML IJ SOLN
4.0000 mg | Freq: Two times a day (BID) | INTRAMUSCULAR | Status: DC
  Administered 2011-10-25 – 2011-10-26 (×2): 4 mg via INTRAVENOUS
  Filled 2011-10-25 (×4): qty 2

## 2011-10-25 MED ORDER — AMPHETAMINE-DEXTROAMPHETAMINE 20 MG PO TABS
20.0000 mg | ORAL_TABLET | Freq: Two times a day (BID) | ORAL | Status: DC
  Administered 2011-10-25: 20 mg via ORAL
  Filled 2011-10-25: qty 1

## 2011-10-25 NOTE — Progress Notes (Signed)
Patient felt that dilaudid was causing migraine headaches, however patient had a headache prior to dilaudid being given. Contacted Night coverage MD (hospitalist), dilaudid d/c'd (see MAR). Patient stated that due to her "having a headache for over 11 hours, and making me have anxiety". One time dose of xanax was ordered, and morphine was ordered q4h for pain.   Patient was notified and was pleased with this outcome. Meds were given and proven effective. Patient is currently resting comfortably. Will continue to monitor patient.  Farley, Shannon Kopke L 10/25/2011

## 2011-10-25 NOTE — Progress Notes (Signed)
Pt had one episode of nausea and vomited. Pt was given anti-emetic. Will continue to monitor patient for discomfort.   [name], [name] 10/25/2011

## 2011-10-25 NOTE — Progress Notes (Signed)
[facility] Progress Note    Since last GI note: Consulted yesterday.  I started her on cipro/flagyl (for presumed infectious colitis).  Overnight:  No diarrhea in about 24 hours.  Still with nausea, a couple vomiting episodes. Abd feels crampy.    Objective: Vital signs in last 24 hours: Temp:  [98.2 F (36.8 C)] 98.2 F (36.8 C) (10/18 0604) Pulse Rate:  [100-118] 118  (10/18 0604) Resp:  [18-20] 18  (10/18 0604) BP: (125-137)/(86-87) 125/87 mmHg (10/18 0604) SpO2:  [95 %] 95 % (10/18 0604) Last BM Date: 10/24/11 General: alert and oriented times 3 Heart: regular rate and rythm Abdomen: soft, non-tender, non-distended, normal bowel sounds   Lab Results:  Basename 10/25/11 0435 10/24/11 0435 10/23/11 2143 10/23/11 1655  WBC 7.7 -- -- 10.7*  HGB 13.4 13.6 13.7 --  PLT 270 -- -- 368  MCV 88.6 -- -- 87.9    Basename 10/24/11 0435 10/23/11 1655  NA 138 137  K 3.8 4.2  CL 105 97  CO2 26 26  GLUCOSE 95 100*  BUN 5* 7  CREATININE 0.77 0.65  CALCIUM 9.1 10.9*    Basename 10/23/11 1655  PROT 7.9  ALBUMIN 4.4  AST 20  ALT 25  ALKPHOS 75  BILITOT 0.3  BILIDIR --  IBILI --    Basename 10/23/11 1655  INR 0.90    Medications: Scheduled Meds:   . amphetamine-dextroamphetamine  20 mg Oral Daily  . ciprofloxacin  400 mg Intravenous Q12H  . LORazepam  0.5 mg Oral Once  . metronidazole  500 mg Intravenous Q8H  . nicotine  21 mg Transdermal Once  . nicotine  21 mg Transdermal Once  . sodium chloride  3 mL Intravenous Q12H  . DISCONTD: nicotine  21 mg Transdermal Once   Continuous Infusions:   . sodium chloride 125 mL/hr (10/24/11 1937)  . DISCONTD: sodium chloride 125 mL/hr at 10/23/11 1812   PRN Meds:.acetaminophen, aspirin-acetaminophen-caffeine, morphine injection, promethazine, DISCONTD:  HYDROmorphone (DILAUDID) injection    Assessment/Plan: 51 y.o. female with presumed infectious colitis, gastroenteritis  Improving: diarrhea/bleeding  stopped.  Nausea remains, will change antiemetics to scheduled dosing.  She knows to ambulate.  Abx started yesterday, empirically, would continue for 3-4 more days total.  She will eventually need to see me in office in 3-4 weeks to discuss colonoscopy (as long as this episodes resolves, if not then colonsocopy could be done while in hosp).    Rob Bunting, MD  10/25/2011, 8:07 AM [facility] Pager [phone]

## 2011-10-25 NOTE — Progress Notes (Signed)
PROGRESS NOTE  Shannon Farley ZOX:[id number] DOB: November 25, 1960 DOA: 10/23/2011 PCP: No primary provider on file.  Brief narrative: 51 yr old CF admitted with N/V and bloody diarrhoea-she states that this started abruptly on the morning of 10/23/2011. She developed some nausea but then progressed to have sudden onset profuse watery diarrhea. She does not know exactly what color the diarrhea was initially, and at 9:30 that morning, she developed bright red blood passage per rectum. She stated that she has lower abdominal pain but did not really have much vomiting other than one episode at around 10 in the morning 10/23/2011.   Past medical history-As per Problem list Chart reviewed as below- No significant past medical history States she's been diagnosed with migraine headaches-Excedrin works the best for her States she has ADD-recently placed back on Adderall Moved from [place] one year ago-her family care physician is Dr. Madelin Rear  Consultants:  Gastroenterology  Procedures:  CT scan 10/23/11 concerning for either it she may colitis with mild edema versus infectious process  Antibiotics:  Ciprofloxacin 10/17  Flagyl 10/17   Subjective  States she has a headache-this is worse than yesterday.  Excedrin did not work-she states she was Rx with Opiates in the past-her last migraine was maybe 2 months ago which was relieved by excedrin-light and sound bother her eyes, states she was working on the computer last night which was what probably worsened her headache.  Feels like the pain moves from  L neck up spine and wraps intoher Parieto-occipital region with radiation behind the eye and feels like a sinus pressure She does not have any nausea and has been sipping fluids She denies any shortness of breath and chest pain   Objective    Interim History: Chart reviewed  Telemetry: Sinus tachycardia into the low 100  Objective: Filed Vitals:   10/23/11 2301 10/24/11 0702 10/24/11  1348 10/25/11 0604  BP: 111/76 116/82 137/86 125/87  Pulse: 96 104 100 118  Temp: 97.3 F (36.3 C) 97.8 F (36.6 C) 98.2 F (36.8 C) 98.2 F (36.8 C)  TempSrc: Oral Oral Oral Oral  Resp: 18 18 20 18   Height: 5\' 5"  (1.651 m)     Weight: 73.6 kg (162 lb 4.1 oz)     SpO2: 94% 92% 95% 95%    Intake/Output Summary (Last 24 hours) at 10/25/11 1202 Last data filed at 10/24/11 1258  Gross per 24 hour  Intake      0 ml  Output    500 ml  Net   -500 ml    Exam:  General: Alert oriented pleasant Caucasian female looking about stated age, no pallor no icterus with no throat injection Neck is soft, but some stiffness on the splenius muscles Cardiovascular: S1-S2 no murmur rub or gallop tachycardic Respiratory: Clinically clear no added sound Abdomen: Bowel sounds positive, bilateral lower quadrant tenderness, no rebound or guarding Skin no swelling Neuro motor is grossly intact  Data Reviewed: Basic Metabolic Panel:  Lab 10/24/11 8119 10/23/11 1655  NA 138 137  K 3.8 4.2  CL 105 97  CO2 26 26  GLUCOSE 95 100*  BUN 5* 7  CREATININE 0.77 0.65  CALCIUM 9.1 10.9*  MG -- --  PHOS -- --   Liver Function Tests:  Lab 10/23/11 1655  AST 20  ALT 25  ALKPHOS 75  BILITOT 0.3  PROT 7.9  ALBUMIN 4.4   No results found for this basename: LIPASE:5,AMYLASE:5 in the last 168 hours No results found  for this basename: AMMONIA:5 in the last 168 hours CBC:  Lab 10/25/11 0435 10/24/11 0435 10/23/11 2143 10/23/11 1655  WBC 7.7 -- -- 10.7*  NEUTROABS -- -- -- 7.8*  HGB 13.4 13.6 13.7 16.5*  HCT 40.6 -- -- 48.0*  MCV 88.6 -- -- 87.9  PLT 270 -- -- 368   Cardiac Enzymes: No results found for this basename: CKTOTAL:5,CKMB:5,CKMBINDEX:5,TROPONINI:5 in the last 168 hours BNP: No components found with this basename: POCBNP:5 CBG: No results found for this basename: GLUCAP:5 in the last 168 hours  No results found for this or any previous visit (from the past 240 hour(s)).    Studies:              All Imaging reviewed and is as per above notation   Scheduled Meds:    . amphetamine-dextroamphetamine  20 mg Oral Daily  . ciprofloxacin  400 mg Intravenous Q12H  . LORazepam  0.5 mg Oral Once  . metronidazole  500 mg Intravenous Q8H  . nicotine  21 mg Transdermal Once  . nicotine  21 mg Transdermal Once  . ondansetron  4 mg Intravenous BID  . sodium chloride  3 mL Intravenous Q12H  . DISCONTD: nicotine  21 mg Transdermal Once   Continuous Infusions:    . sodium chloride 125 mL/hr (10/24/11 1937)     Assessment/Plan: 1. Abdominal pain-probable viral gastroenteritis versus colitis-patient does not present with any specific risk factors either Cdiff or ischemic colitis.  On closer questioning patient states that she bought 2 steaks on sale at [employer] and had been the night before the symptoms started-am inclined to think that this is potentially related to contaminated meat-Continue empiric Cipro flagyll IV per GI-If she is able to tolerate PO, she will be able to potentially transition to PO antibiotics tomorrow am 2. Anemia-multifactorial-dilutional + secondary to GI losses-CBC has remained stable at 13.4 3. Headache-continue Excedrin Migraine-given low dose opiates and robaxin as what she describes sounds like a tension/MSK headache with radiation 4. Tobacco abuse-patient on nicotine patch 5. ADD-restart Adderall 20 mg daily  Code Status: Full code Family Communication: None at bedside Disposition Plan: Inpatient   Pleas Koch, MD  [facility] Pager [phone] 10/25/2011, 12:02 PM    LOS: 2 days

## 2011-10-26 MED ORDER — OXYCODONE-ACETAMINOPHEN 5-325 MG PO TABS: 1.0000 | ORAL_TABLET | ORAL | Status: DC | PRN

## 2011-10-26 MED ORDER — AMPHETAMINE-DEXTROAMPHETAMINE 20 MG PO TABS
20.0000 mg | ORAL_TABLET | Freq: Three times a day (TID) | ORAL | Status: DC | PRN
  Filled 2011-10-26: qty 1

## 2011-10-26 MED ORDER — PROMETHAZINE HCL 25 MG PO TABS: 25.0000 mg | ORAL_TABLET | Freq: Three times a day (TID) | ORAL | Status: DC | PRN

## 2011-10-26 MED ORDER — CIPROFLOXACIN HCL 500 MG PO TABS: 500.0000 mg | ORAL_TABLET | Freq: Two times a day (BID) | ORAL | Status: AC

## 2011-10-26 MED ORDER — AMPHETAMINE-DEXTROAMPHETAMINE 20 MG PO TABS
20.0000 mg | ORAL_TABLET | Freq: Once | ORAL | Status: AC
  Administered 2011-10-26: 20 mg via ORAL

## 2011-10-26 MED ORDER — NICOTINE 21 MG/24HR TD PT24: 1.0000 | MEDICATED_PATCH | TRANSDERMAL | Status: DC

## 2011-10-26 MED ORDER — AMPHETAMINE-DEXTROAMPHETAMINE 20 MG PO TABS: 20.0000 mg | ORAL_TABLET | Freq: Every day | ORAL | Status: DC

## 2011-10-26 MED ORDER — METRONIDAZOLE 500 MG PO TABS: 500.0000 mg | ORAL_TABLET | Freq: Three times a day (TID) | ORAL | Status: AC

## 2011-10-26 NOTE — Progress Notes (Signed)
Progress Note for [facility]  Subjective: Feeling well.  No further diarrhea for the past 2 days and she had mild nausea yesterday.   Objective: Vital signs in last 24 hours: Temp:  [97.5 F (36.4 C)-98.5 F (36.9 C)] 98.5 F (36.9 C) (10/19 0550) Pulse Rate:  [90-136] 122  (10/19 0550) Resp:  [18-20] 18  (10/19 0550) BP: (106-129)/(68-85) 117/68 mmHg (10/19 0550) SpO2:  [95 %-99 %] 97 % (10/19 0550) Last BM Date: 10/24/11  Intake/Output from previous day: 10/18 0701 - 10/19 0700 In: 6602.1 [P.O.:200; I.V.:5102.1; IV Piggyback:1300] Out: 100 [Urine:100] Intake/Output this shift:    General appearance: alert and no distress GI: soft, non-tender; bowel sounds normal; no masses,  no organomegaly  Lab Results:  Basename 10/25/11 0435 10/24/11 0435 10/23/11 2143 10/23/11 1655  WBC 7.7 -- -- 10.7*  HGB 13.4 13.6 13.7 --  HCT 40.6 -- -- 48.0*  PLT 270 -- -- 368   BMET  Basename 10/24/11 0435 10/23/11 1655  NA 138 137  K 3.8 4.2  CL 105 97  CO2 26 26  GLUCOSE 95 100*  BUN 5* 7  CREATININE 0.77 0.65  CALCIUM 9.1 10.9*   LFT  Basename 10/23/11 1655  PROT 7.9  ALBUMIN 4.4  AST 20  ALT 25  ALKPHOS 75  BILITOT 0.3  BILIDIR --  IBILI --   PT/INR  Basename 10/23/11 1655  LABPROT 12.1  INR 0.90   Hepatitis Panel No results found for this basename: HEPBSAG,HCVAB,HEPAIGM,HEPBIGM in the last 72 hours C-Diff No results found for this basename: CDIFFTOX:3 in the last 72 hours Fecal Lactopherrin No results found for this basename: FECLLACTOFRN in the last 72 hours  Studies/Results: No results found.  Medications:  Scheduled:   . amphetamine-dextroamphetamine  20 mg Oral BID  . ciprofloxacin  400 mg Intravenous Q12H  . fluticasone  1 spray Each Nare Daily  . methocarbamol  500 mg Oral Once  . metronidazole  500 mg Intravenous Q8H  . nicotine  21 mg Transdermal Once  . nicotine  21 mg Transdermal Daily  . ondansetron  4 mg Intravenous BID  . sodium  chloride  3 mL Intravenous Q12H  . DISCONTD: amphetamine-dextroamphetamine  20 mg Oral Daily   Continuous:   . sodium chloride 75 mL/hr at 10/25/11 1923    Assessment/Plan: 1) Colitis - Infectious versus inflammatory.   Clinically the patient appears well.  From the GI standpoint she can be D/C'ed with follow up with Dr. Larae Grooms.  Plan: 1) Continue with antibiotics. 2) Signing off. 3) Follow up with Dr. Larae Grooms in the office.  LOS: 3 days   Shannon Farley D 10/26/2011, 7:33 AM

## 2011-10-26 NOTE — Progress Notes (Signed)
She is in no distress, and thanks [place] for our care.  She is given d/c instructions, and prescriptions for Cipro, Flagyl, Phenergan, Adderall, and nicotine patch; and signs for receipt of same.  She is taken to her vehicle at this time per w/c without incident.  Her significant other is here and is driving.

## 2011-10-26 NOTE — Discharge Summary (Signed)
Physician Discharge Summary  Shannon Farley MRN: [id number] DOB/AGE: 49-May-1962 49 y.o.  PCP: No primary provider on file.   Admit date: 10/23/2011 Discharge date: 10/26/2011  Discharge Diagnoses:     *GI bleed  Infectious versus ischemic colitis  Ovarian cyst  Colitis  Vomiting     Medication List     As of 10/26/2011 12:02 PM    TAKE these medications         amphetamine-dextroamphetamine 20 MG tablet   Commonly known as: ADDERALL   Take 1 tablet (20 mg total) by mouth daily.      aspirin-acetaminophen-caffeine 250-250-65 MG per tablet   Commonly known as: EXCEDRIN MIGRAINE   Take 1 tablet by mouth every 6 (six) hours as needed. headache      ciprofloxacin 500 MG tablet   Commonly known as: CIPRO   Take 1 tablet (500 mg total) by mouth 2 (two) times daily.      metroNIDAZOLE 500 MG tablet   Commonly known as: FLAGYL   Take 1 tablet (500 mg total) by mouth 3 (three) times daily.      nicotine 21 mg/24hr patch   Commonly known as: NICODERM CQ - dosed in mg/24 hours   Place 1 patch onto the skin daily.      oxyCODONE-acetaminophen 5-325 MG per tablet   Commonly known as: PERCOCET/ROXICET   Take 1 tablet by mouth every 4 (four) hours as needed.      promethazine 25 MG tablet   Commonly known as: PHENERGAN   Take 1 tablet (25 mg total) by mouth every 8 (eight) hours as needed for nausea.        Discharge Condition: Stable   Disposition: 01-Home or Self Care   Consults: Gastroenterology   Significant Diagnostic Studies: Ct Abdomen Pelvis W Contrast  10/23/2011  *RADIOLOGY REPORT*  Clinical Data: Rectal bleeding with abdominal cramping.  CT ABDOMEN AND PELVIS WITH CONTRAST  Technique:  Multidetector CT imaging of the abdomen and pelvis was performed following the standard protocol during bolus administration of intravenous contrast.  Contrast:  100 ml Omnipaque-300  Comparison: None.  Findings: Liver and spleen are normal in appearance.  The  stomach, duodenum, pancreas, gallbladder, adrenal glands, and right kidney are normal.  There is some cortical scarring laterally in the upper pole of the left kidney.  No abdominal aortic aneurysm.  No free fluid or lymphadenopathy in the abdomen.  There is circumferential edematous wall thickening in the splenic flexure which extends about halfway down the descending colon, but is mainly limited to the splenic flexure area.  The portal vein, splenic vein, and superior mesenteric vein are patent.  The celiac axis and superior mesenteric artery appear widely patent to their proximal segments.  Imaging through the pelvis shows no free intraperitoneal fluid.  No pelvic sidewall lymphadenopathy.  The bladder is normal.  Uterus is unremarkable.  2.7 cm cystic structure is identified in the right ovary.  1.3 cm oval fatty lesion is seen along the anteroinferior aspect of the left ovary.  This is probably a small dermoid.  The terminal ileum and the appendix are normal.  Bone windows reveal no worrisome lytic or sclerotic osseous lesions.  IMPRESSION: Mild circumferential edematous wall thickening in the splenic flexure.  Differential considerations would include inflammatory, infectious, and ischemic colitis.  2.7 cm cystic lesion in the right ovary is likely a dominant follicle.  There is a 13 mm fatty lesion in the region of the left ovary which  may represent a small left ovarian dermoid.  Follow-up pelvic ultrasound could be performed in a non emergent fashion to further characterize.   Original Report Authenticated By: ERIC A. MANSELL, M.D.        Microbiology: No results found for this or any previous visit (from the past 240 hour(s)).   Labs: Results for orders placed during the hospital encounter of 10/23/11 (from the past 48 hour(s))  CBC     Status: Normal   Collection Time   10/25/11  4:35 AM      Component Value Range Comment   WBC 7.7  4.0 - 10.5 K/uL    RBC 4.58  3.87 - 5.11 MIL/uL     Hemoglobin 13.4  12.0 - 15.0 g/dL    HCT 40.9  81.1 - 91.4 %    MCV 88.6  78.0 - 100.0 fL    MCH 29.3  26.0 - 34.0 pg    MCHC 33.0  30.0 - 36.0 g/dL    RDW 78.2  95.6 - 21.3 %    Platelets 270  150 - 400 K/uL   GLUCOSE, CAPILLARY     Status: Abnormal   Collection Time   10/25/11 12:05 PM      Component Value Range Comment   Glucose-Capillary 107 (*) 70 - 99 mg/dL      HPI  Shannon Farley is a 51 y.o. female with minimal PMH who presented to [facility] ED on 10/16 with complaints of sudden onset lower abdominal pain and rectal bleeding. CT scan revealed mild circumferential edematous wall thickening in the splenic flexure. Patient was admitted for further evaluation and GI was consulted.  Patient awoke the morning of 10/16 with cramping abdominal pain and felt like she had to move her bowels. Had a couple of watery bowel movements, but did not look at them initially. At short time later she noticed blood in the toilet bowl, a couple of tablespoons. This occurred several times throughout the time, about every 30 to 60 minutes. She began to feel weak and drained as if she might pass out, so came to the ED. Hgb was initially 16.5 grams and this AM is down to 13.6 grams.  Says that she had some nausea and one episode of vomiting yesterday morning. Had only a couple bouts of passing blood since presenting to the ED, becoming much less in frequency. Last was early this AM. Usually has regular bowel movements with no significant constipation or diarrhea. Never had similar symptoms previously; has never seen blood in the past. No family history of colon cancer or IBD to her knowledge. Other than medications listed, she takes occasional OTC NSAID or Goodie, but on very rare occasion   HOSPITAL COURSE: 1. #1Abdominal pain-probable viral gastroenteritis versus infectious/ischemic colitis-patient does not present with any specific risk factors for it she may call and. On closer questioning patient states that she  bought 2 steaks on sale at [employer] and had been the night before the symptoms started-am inclined to think that this is potentially related to contaminated meat-she states she's been eating a lot of chicken as well recently. She was empirically started on ciprofloxacin and Flagyl, she will go home on another 7 days. Patient was seen by GI today and has been relatively stable 2. Anemia-multifactorial-dilutional + secondary to GI losses-CBC remained stable, GI has signed off as there is no concern for recurrent bleeding 3. Headache-started on Excedrin Migraine-discontinued upon discharge  4. Tobacco abuse-patient on nicotine patch, provided for  home as well 5. ADD-restart Adderall 20 mg daily, 1 refill given per patient request    Discharge Exam:  Blood pressure 117/68, pulse 122, temperature 98.5 F (36.9 C), temperature source Oral, resp. rate 18, height 5\' 5"  (1.651 m), weight 73.6 kg (162 lb 4.1 oz), SpO2 97.00%.  General: Alert oriented pleasant Caucasian female looking about stated age, no pallor no icterus with no throat injection  Cardiovascular: S1-S2 no murmur rub or gallop tachycardic  Respiratory: Clinically clear no added sound  Abdomen: Bowel sounds positive, bilateral lower quadrant tenderness, no rebound or guarding  Skin no swelling  Neuro motor is grossly intact          Follow-up Information    Follow up with pcp. Schedule an appointment as soon as possible for a visit in 1 week.      Follow up with Rob Bunting, MD. Schedule an appointment as soon as possible for a visit in 1 week.   Contact information:   [address] [place] [address] [phone]          Signed: Richarda Overlie 10/26/2011, 12:02 PM

## 2012-01-22 ENCOUNTER — Encounter (HOSPITAL_COMMUNITY): Payer: Self-pay | Admitting: Emergency Medicine

## 2012-01-22 ENCOUNTER — Emergency Department (HOSPITAL_COMMUNITY)
Admission: EM | Admit: 2012-01-22 | Discharge: 2012-01-22 | Disposition: A | Discharge status: Home / Self Care | Payer: Self-pay | Attending: Emergency Medicine | Admitting: Emergency Medicine

## 2012-01-22 DIAGNOSIS — Z79899 Other long term (current) drug therapy: Secondary | ICD-10-CM | POA: Insufficient documentation

## 2012-01-22 DIAGNOSIS — Z760 Encounter for issue of repeat prescription: Secondary | ICD-10-CM | POA: Insufficient documentation

## 2012-01-22 DIAGNOSIS — F17200 Nicotine dependence, unspecified, uncomplicated: Secondary | ICD-10-CM | POA: Insufficient documentation

## 2012-01-22 DIAGNOSIS — F909 Attention-deficit hyperactivity disorder, unspecified type: Secondary | ICD-10-CM | POA: Insufficient documentation

## 2012-01-22 DIAGNOSIS — Z8679 Personal history of other diseases of the circulatory system: Secondary | ICD-10-CM | POA: Insufficient documentation

## 2012-01-22 DIAGNOSIS — Z7982 Long term (current) use of aspirin: Secondary | ICD-10-CM | POA: Insufficient documentation

## 2012-01-22 DIAGNOSIS — F411 Generalized anxiety disorder: Secondary | ICD-10-CM | POA: Insufficient documentation

## 2012-01-22 HISTORY — DX: Attention-deficit hyperactivity disorder, unspecified type: F90.9

## 2012-01-22 MED ORDER — AMPHETAMINE-DEXTROAMPHETAMINE 30 MG PO TABS: ORAL_TABLET | ORAL | Status: DC

## 2012-01-22 NOTE — ED Provider Notes (Signed)
Medical screening examination/treatment/procedure(s) were performed by non-physician practitioner and as supervising physician I was immediately available for consultation/collaboration.   Blair Lundeen, MD 01/22/12 2349 

## 2012-01-22 NOTE — ED Notes (Signed)
Pt presenting to ed with c/o needing adderall prescription. Pt states she thought she had a doctors appointment yesterday to get it filled but her appointment isn't until 1/23 and she needs enough until her appointment

## 2012-01-22 NOTE — ED Provider Notes (Signed)
History   This chart was scribed for Shannon Gourd, PA-C working with Shannon Booze, MD by Shannon Farley, ED Scribe. This patient was seen in room WTR9/WTR9 and the patient's care was started at 1742.   CSN: [id number]  Arrival date & time 01/22/12  1731   First MD Initiated Contact with Patient 01/22/12 1742      Chief Complaint  Patient presents with  . Medication Refill    The history is provided by the patient. No language interpreter was used.   Shannon Farley is a 52 y.o. female who presents to the Emergency Department for medication refill. She states that she has a h/o ADHD and takes 60 mg of Adderall daily. Each month she is switched from 20 mg TID to 30 mg BID and so on. She states that she took her last Adderall yesterday and didn't realize she was taking 30 mg TID. She reports associated anxiety and states she is starting to feel ill from not having todays dose. She states that she doesn't have an appointment until 01/28/12 and needs a prescription for enough Adderall to make it to her next appointment. She states that she is followed by Dr. Flo Farley clinic.   Past Medical History  Diagnosis Date  . Migraines   . ADHD (attention deficit hyperactivity disorder)     Past Surgical History  Procedure Date  . Tubal ligation     56yrs ago    Family History  Problem Relation Age of Onset  . Cancer Mother 29    pancreatic cancer  . Cancer Father 9    lung cancer    History  Substance Use Topics  . Smoking status: Current Every Day Smoker -- 1.0 packs/day for 15 years    Types: Cigarettes  . Smokeless tobacco: Never Used  . Alcohol Use: No     Comment: 2 beers a week    OB History    Grav Para Term Preterm Abortions TAB SAB Ect Mult Living   2 2 2       2       Review of Systems  Psychiatric/Behavioral: The patient is nervous/anxious.   All other systems reviewed and are negative.    Allergies  Penicillins  Home Medications   Current Outpatient Rx    Name  Route  Sig  Dispense  Refill  . AMPHETAMINE-DEXTROAMPHETAMINE 30 MG PO TABS   Oral   Take 30 mg by mouth 2 (two) times daily.         . ASPIRIN-ACETAMINOPHEN-CAFFEINE 250-250-65 MG PO TABS   Oral   Take 2 tablets by mouth every 6 (six) hours as needed. headache         . CETIRIZINE-PSEUDOEPHEDRINE ER 5-120 MG PO TB12   Oral   Take 1 tablet by mouth daily as needed. For allergies.           BP 126/86  Pulse 116  Temp 98.2 F (36.8 C) (Oral)  Resp 18  SpO2 100%  Physical Exam  Nursing note and vitals reviewed. Constitutional: She is oriented to person, place, and time. She appears well-developed and well-nourished. No distress.  HENT:  Head: Normocephalic and atraumatic.  Eyes: Conjunctivae normal and EOM are normal.  Neck: Neck supple. No tracheal deviation present.  Cardiovascular: Normal rate, regular rhythm and normal heart sounds.   Pulmonary/Chest: Effort normal and breath sounds normal. No respiratory distress.  Musculoskeletal: Normal range of motion.  Neurological: She is alert and oriented to person, place, and  time.  Skin: Skin is warm and dry.  Psychiatric: Her mood appears anxious. She is agitated.    ED Course  Procedures (including critical care time)  DIAGNOSTIC STUDIES: Oxygen Saturation is 100% on room air, normal by my interpretation.    COORDINATION OF CARE:  17:50-Discussed planned course of treatment with the patient including calling Dr. Flo Farley clinic, who is agreeable at this time.     Labs Reviewed - No data to display No results found.   1. Medication refill       MDM  52 y/o female needing refill on Adderall. Received 30 day supply on 12/23. I tried calling Dr. Flo Farley office without being able to reach him. I sent out a page and never heard back. I will give her enough Adderall to get to her next appointment.   I personally performed the services described in this documentation, which was scribed in my presence.  The recorded information has been reviewed and is accurate.       Trevor Mace, PA-C 01/22/12 [phone]

## 2012-02-21 ENCOUNTER — Encounter (HOSPITAL_BASED_OUTPATIENT_CLINIC_OR_DEPARTMENT_OTHER): Payer: Self-pay | Admitting: *Deleted

## 2012-02-21 ENCOUNTER — Emergency Department (HOSPITAL_BASED_OUTPATIENT_CLINIC_OR_DEPARTMENT_OTHER)
Admission: EM | Admit: 2012-02-21 | Discharge: 2012-02-21 | Disposition: A | Discharge status: Home / Self Care | Payer: Self-pay | Attending: Emergency Medicine | Admitting: Emergency Medicine

## 2012-02-21 DIAGNOSIS — F909 Attention-deficit hyperactivity disorder, unspecified type: Secondary | ICD-10-CM | POA: Insufficient documentation

## 2012-02-21 DIAGNOSIS — R000 Tachycardia, unspecified: Secondary | ICD-10-CM | POA: Insufficient documentation

## 2012-02-21 DIAGNOSIS — F17200 Nicotine dependence, unspecified, uncomplicated: Secondary | ICD-10-CM | POA: Insufficient documentation

## 2012-02-21 DIAGNOSIS — Z760 Encounter for issue of repeat prescription: Secondary | ICD-10-CM | POA: Insufficient documentation

## 2012-02-21 DIAGNOSIS — F411 Generalized anxiety disorder: Secondary | ICD-10-CM | POA: Insufficient documentation

## 2012-02-21 DIAGNOSIS — Z8679 Personal history of other diseases of the circulatory system: Secondary | ICD-10-CM | POA: Insufficient documentation

## 2012-02-21 DIAGNOSIS — Z79899 Other long term (current) drug therapy: Secondary | ICD-10-CM | POA: Insufficient documentation

## 2012-02-21 MED ORDER — AMPHETAMINE-DEXTROAMPHETAMINE 30 MG PO TABS: 60.0000 mg | ORAL_TABLET | Freq: Every day | ORAL | Status: DC

## 2012-02-21 NOTE — ED Notes (Signed)
Patient states she is out of her Adderall 60 mg for the last 24 hours.  States she spoke with her PCP and was told to come to the ED,  States she has some anxiety related to not having her medications.  States she needs enough medications to get her until Monday when she has an appointment with [facility].

## 2012-02-21 NOTE — ED Provider Notes (Signed)
History     CSN: [id number]  Arrival date & time 02/21/12  1254   None     Chief Complaint  Patient presents with  . Medication Refill    adderall    (HPI Shannon Farley is a 52 y.o. female who present to the ED for medication refill. She states she is out of her Adderall 60 mg. She ran out yesterday. She spoke with her PCP and was told to come to the ED. She is anxious about being out of her medication. Only wants enough to get her through until [facility] opens on Monday.  Past Medical History  Diagnosis Date  . Migraines   . ADHD (attention deficit hyperactivity disorder)     Past Surgical History  Procedure Laterality Date  . Tubal ligation      50yrs ago    Family History  Problem Relation Age of Onset  . Cancer Mother 48    pancreatic cancer  . Cancer Father 71    lung cancer    History  Substance Use Topics  . Smoking status: Current Every Day Smoker -- 1.00 packs/day for 15 years    Types: Cigarettes  . Smokeless tobacco: Never Used  . Alcohol Use: No    OB History   Grav Para Term Preterm Abortions TAB SAB Ect Mult Living   2 2 2       2       Review of Systems  Constitutional: Negative for fever and chills.  HENT: Negative for ear pain, congestion, sore throat, facial swelling, neck pain, neck stiffness, dental problem and sinus pressure.   Eyes: Negative for photophobia, pain and discharge.  Respiratory: Negative for chest tightness.   Cardiovascular: Negative for chest pain and palpitations.  Gastrointestinal: Negative for nausea, vomiting, abdominal pain, diarrhea, constipation and abdominal distention.  Genitourinary: Negative for dysuria, frequency, flank pain and difficulty urinating.  Musculoskeletal: Negative for myalgias, back pain and gait problem.  Skin: Negative for color change and rash.  Neurological: Negative for dizziness, speech difficulty, weakness, light-headedness, numbness and headaches.  Psychiatric/Behavioral: Negative for  confusion and agitation. The patient is nervous/anxious.     Allergies  Penicillins  Home Medications   Current Outpatient Rx  Name  Route  Sig  Dispense  Refill  . ALPRAZolam (XANAX) 0.5 MG tablet   Oral   Take 0.5 mg by mouth at bedtime as needed for sleep.         Marland Kitchen amphetamine-dextroamphetamine (ADDERALL) 30 MG tablet   Oral   Take 30 mg by mouth 2 (two) times daily.         Marland Kitchen amphetamine-dextroamphetamine (ADDERALL) 30 MG tablet      Take 2 tablets by mouth daily.   14 tablet   0   . aspirin-acetaminophen-caffeine (EXCEDRIN MIGRAINE) 250-250-65 MG per tablet   Oral   Take 2 tablets by mouth every 6 (six) hours as needed. headache         . cetirizine-pseudoephedrine (ZYRTEC-D) 5-120 MG per tablet   Oral   Take 1 tablet by mouth daily as needed. For allergies.           BP 133/93  Pulse 120  Temp(Src) 98.1 F (36.7 C) (Oral)  Resp 18  Ht 5\' 6"  (1.676 m)  Wt 135 lb (61.236 kg)  BMI 21.8 kg/m2  SpO2 99%  Physical Exam  Nursing note and vitals reviewed. Constitutional: She is oriented to person, place, and time. She appears well-developed and well-nourished.  HENT:  Head: Normocephalic and atraumatic.  Eyes: EOM are normal. Pupils are equal, round, and reactive to light.  Neck: Neck supple.  Cardiovascular:  Tachycardia   Musculoskeletal: Normal range of motion. She exhibits no edema.  Neurological: She is alert and oriented to person, place, and time. No cranial nerve deficit.  Skin: Skin is warm and dry.  Psychiatric: Her behavior is normal. Judgment and thought content normal. Her mood appears anxious.   Procedures  Assessment: 52 y.o. female here for medication refill  Plan:  Adderall 60 mg po daily Discussed with the patient and all questioned fully answered. She will return if any problems arise.   [facility] Orlene Och, [place] 02/21/12 1406

## 2012-02-22 NOTE — ED Provider Notes (Signed)
Medical screening examination/treatment/procedure(s) were performed by non-physician practitioner and as supervising physician I was immediately available for consultation/collaboration.  Geoffery Lyons, MD 02/22/12 [phone]

## 2012-03-29 ENCOUNTER — Encounter (HOSPITAL_COMMUNITY): Payer: Self-pay

## 2012-03-29 ENCOUNTER — Emergency Department (HOSPITAL_COMMUNITY)
Admission: EM | Admit: 2012-03-29 | Discharge: 2012-03-29 | Disposition: A | Discharge status: Home / Self Care | Payer: Self-pay | Attending: Emergency Medicine | Admitting: Emergency Medicine

## 2012-03-29 ENCOUNTER — Emergency Department (HOSPITAL_COMMUNITY): Payer: Self-pay

## 2012-03-29 DIAGNOSIS — Y929 Unspecified place or not applicable: Secondary | ICD-10-CM | POA: Insufficient documentation

## 2012-03-29 DIAGNOSIS — F17200 Nicotine dependence, unspecified, uncomplicated: Secondary | ICD-10-CM | POA: Insufficient documentation

## 2012-03-29 DIAGNOSIS — Z79899 Other long term (current) drug therapy: Secondary | ICD-10-CM | POA: Insufficient documentation

## 2012-03-29 DIAGNOSIS — M20019 Mallet finger of unspecified finger(s): Secondary | ICD-10-CM | POA: Insufficient documentation

## 2012-03-29 DIAGNOSIS — IMO0001 Reserved for inherently not codable concepts without codable children: Secondary | ICD-10-CM

## 2012-03-29 DIAGNOSIS — Y939 Activity, unspecified: Secondary | ICD-10-CM | POA: Insufficient documentation

## 2012-03-29 DIAGNOSIS — G43909 Migraine, unspecified, not intractable, without status migrainosus: Secondary | ICD-10-CM | POA: Insufficient documentation

## 2012-03-29 DIAGNOSIS — F909 Attention-deficit hyperactivity disorder, unspecified type: Secondary | ICD-10-CM | POA: Insufficient documentation

## 2012-03-29 DIAGNOSIS — X58XXXA Exposure to other specified factors, initial encounter: Secondary | ICD-10-CM | POA: Insufficient documentation

## 2012-03-29 MED ORDER — HYDROCODONE-ACETAMINOPHEN 5-500 MG PO TABS: 1.0000 | ORAL_TABLET | Freq: Four times a day (QID) | ORAL | Status: DC | PRN

## 2012-03-29 MED ORDER — HYDROCODONE-ACETAMINOPHEN 5-325 MG PO TABS
1.0000 | ORAL_TABLET | Freq: Once | ORAL | Status: AC
  Administered 2012-03-29: 1 via ORAL
  Filled 2012-03-29: qty 1

## 2012-03-29 MED ORDER — TRAMADOL HCL 50 MG PO TABS: 50.0000 mg | ORAL_TABLET | Freq: Four times a day (QID) | ORAL | Status: DC | PRN

## 2012-03-29 NOTE — ED Notes (Signed)
Pt states she has a ride home. 

## 2012-03-29 NOTE — ED Notes (Signed)
Pt states "Ultram doesn't work for me." Speaking with PA to have pt's medication adjusted.
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
## 2012-04-01 NOTE — ED Provider Notes (Signed)
Medical screening examination/treatment/procedure(s) were performed by non-physician practitioner and as supervising physician I was immediately available for consultation/collaboration.   Richardean Canal, MD 04/01/12 [phone]

## 2012-06-15 ENCOUNTER — Emergency Department (HOSPITAL_COMMUNITY)
Admission: EM | Admit: 2012-06-15 | Discharge: 2012-06-15 | Disposition: A | Discharge status: Home / Self Care | Payer: Self-pay | Attending: Emergency Medicine | Admitting: Emergency Medicine

## 2012-06-15 ENCOUNTER — Emergency Department (HOSPITAL_COMMUNITY): Payer: Self-pay

## 2012-06-15 ENCOUNTER — Encounter (HOSPITAL_COMMUNITY): Payer: Self-pay | Admitting: Emergency Medicine

## 2012-06-15 DIAGNOSIS — R6883 Chills (without fever): Secondary | ICD-10-CM | POA: Insufficient documentation

## 2012-06-15 DIAGNOSIS — R05 Cough: Secondary | ICD-10-CM | POA: Insufficient documentation

## 2012-06-15 DIAGNOSIS — R059 Cough, unspecified: Secondary | ICD-10-CM | POA: Insufficient documentation

## 2012-06-15 DIAGNOSIS — Z79899 Other long term (current) drug therapy: Secondary | ICD-10-CM | POA: Insufficient documentation

## 2012-06-15 DIAGNOSIS — R062 Wheezing: Secondary | ICD-10-CM | POA: Insufficient documentation

## 2012-06-15 DIAGNOSIS — R0789 Other chest pain: Secondary | ICD-10-CM | POA: Insufficient documentation

## 2012-06-15 DIAGNOSIS — J209 Acute bronchitis, unspecified: Secondary | ICD-10-CM | POA: Insufficient documentation

## 2012-06-15 DIAGNOSIS — Z8679 Personal history of other diseases of the circulatory system: Secondary | ICD-10-CM | POA: Insufficient documentation

## 2012-06-15 DIAGNOSIS — R197 Diarrhea, unspecified: Secondary | ICD-10-CM | POA: Insufficient documentation

## 2012-06-15 DIAGNOSIS — F909 Attention-deficit hyperactivity disorder, unspecified type: Secondary | ICD-10-CM | POA: Insufficient documentation

## 2012-06-15 DIAGNOSIS — F17200 Nicotine dependence, unspecified, uncomplicated: Secondary | ICD-10-CM | POA: Insufficient documentation

## 2012-06-15 LAB — POCT I-STAT TROPONIN I: Troponin i, poc: 0 ng/mL (ref 0.00–0.08)

## 2012-06-15 LAB — CBC
HCT: 39.2 % (ref 36.0–46.0)
Hemoglobin: 12.9 g/dL (ref 12.0–15.0)
MCH: 28.8 pg (ref 26.0–34.0)
MCHC: 32.9 g/dL (ref 30.0–36.0)
MCV: 87.5 fL (ref 78.0–100.0)
Platelets: 374 10*3/uL (ref 150–400)
RBC: 4.48 MIL/uL (ref 3.87–5.11)
RDW: 13.8 % (ref 11.5–15.5)
WBC: 11.2 10*3/uL — ABNORMAL HIGH (ref 4.0–10.5)

## 2012-06-15 LAB — BASIC METABOLIC PANEL
BUN: 8 mg/dL (ref 6–23)
CO2: 21 mEq/L (ref 19–32)
GFR calc non Af Amer: >90 mL/min (ref >90)
Glucose, Bld: 114 mg/dL — ABNORMAL HIGH (ref 70–99)
Potassium: 4.4 mEq/L (ref 3.5–5.1)
Sodium: 137 mEq/L (ref 135–145)

## 2012-06-15 MED ORDER — IPRATROPIUM BROMIDE 0.02 % IN SOLN
0.5000 mg | Freq: Once | RESPIRATORY_TRACT | Status: AC
  Administered 2012-06-15: 0.5 mg via RESPIRATORY_TRACT
  Filled 2012-06-15: qty 2.5

## 2012-06-15 MED ORDER — MUCINEX DM 30-600 MG PO TB12: 1.0000 | ORAL_TABLET | Freq: Two times a day (BID) | ORAL | Status: DC | PRN

## 2012-06-15 MED ORDER — ALPRAZOLAM 0.5 MG PO TABS
0.5000 mg | ORAL_TABLET | Freq: Once | ORAL | Status: AC
  Administered 2012-06-15: 0.5 mg via ORAL
  Filled 2012-06-15: qty 1

## 2012-06-15 MED ORDER — AEROCHAMBER PLUS FLO-VU MEDIUM MISC
1.0000 | Freq: Once | Status: AC
  Administered 2012-06-15: 1
  Filled 2012-06-15: qty 1

## 2012-06-15 MED ORDER — ALBUTEROL SULFATE HFA 108 (90 BASE) MCG/ACT IN AERS
2.0000 | INHALATION_SPRAY | RESPIRATORY_TRACT | Status: DC | PRN
  Filled 2012-06-15: qty 6.7

## 2012-06-15 MED ORDER — ALBUTEROL SULFATE (5 MG/ML) 0.5% IN NEBU
5.0000 mg | INHALATION_SOLUTION | Freq: Once | RESPIRATORY_TRACT | Status: AC
  Administered 2012-06-15: 5 mg via RESPIRATORY_TRACT
  Filled 2012-06-15: qty 1

## 2012-06-15 MED ORDER — PREDNISONE 20 MG PO TABS
ORAL_TABLET | ORAL | Status: DC
Start: 2012-06-15 — End: 2015-06-10

## 2012-06-15 NOTE — Progress Notes (Signed)
[facility] has seen patient and provided her with a oc application and a list of primary care resources.

## 2012-06-15 NOTE — ED Provider Notes (Signed)
History     CSN: [id number]  Arrival date & time 06/15/12  3086   First MD Initiated Contact with Patient 06/15/12 [phone]      Chief Complaint  Patient presents with  . Shortness of Breath  . Chest Pain    (Consider location/radiation/quality/duration/timing/severity/associated sxs/prior treatment) HPI Patient relates this is her third day of feeling bad. She states she woke up feeling short of breath with lung congestion with chest tightness and wheezing. She states her throat is not painful but it's sore from coughing. She denies known fever but is having chills. She denies nausea or vomiting but had multiple episodes of diarrhea yesterday. She states she's been taking plain Mucinex over-the-counter without relief. She states she was evaluated for asthma as a child but has not used an inhaler as an adult. She has not been around anybody else is ill.  [facility]  Past Medical History  Diagnosis Date  . Migraines   . ADHD (attention deficit hyperactivity disorder)     Past Surgical History  Procedure Laterality Date  . Tubal ligation      69yrs ago    Family History  Problem Relation Age of Onset  . Cancer Mother 3    pancreatic cancer  . Cancer Father 32    lung cancer    History  Substance Use Topics  . Smoking status: Current Every Day Smoker -- 0.50 packs/day for 15 years    Types: Cigarettes  . Smokeless tobacco: Never Used  . Alcohol Use: No     Comment: occassionally  recently laid off from work  OB History   Grav Para Term Preterm Abortions TAB SAB Ect Mult Living   2 2 2       2       Review of Systems  All other systems reviewed and are negative.    Allergies  Penicillins  Home Medications   Current Outpatient Rx  Name  Route  Sig  Dispense  Refill  . ALPRAZolam (XANAX) 0.5 MG tablet   Oral   Take 0.5 mg by mouth 2 (two) times daily.          Marland Kitchen amphetamine-dextroamphetamine (ADDERALL) 30 MG tablet   Oral   Take 30 mg by mouth  2 (two) times daily.         Marland Kitchen aspirin-acetaminophen-caffeine (EXCEDRIN MIGRAINE) 250-250-65 MG per tablet   Oral   Take 2 tablets by mouth every 6 (six) hours as needed. headache         . cetirizine-pseudoephedrine (ZYRTEC-D) 5-120 MG per tablet   Oral   Take 1 tablet by mouth daily as needed. For allergies.         Marland Kitchen traMADol (ULTRAM) 50 MG tablet   Oral   Take 50 mg by mouth every 6 (six) hours as needed (headaches).           BP 142/84  Pulse 108  Temp(Src) 99 F (37.2 C)  Resp 20  SpO2 100%  Vital signs normal except tachycardia   Physical Exam  Nursing note and vitals reviewed. Constitutional: She is oriented to person, place, and time. She appears well-developed and well-nourished.  Non-toxic appearance. She does not appear ill. No distress.  HENT:  Head: Normocephalic and atraumatic.  Right Ear: External ear normal.  Left Ear: External ear normal.  Nose: Nose normal. No mucosal edema or rhinorrhea.  Mouth/Throat: Oropharynx is clear and moist and mucous membranes are normal. No dental abscesses or edematous.  Eyes:  Conjunctivae and EOM are normal. Pupils are equal, round, and reactive to light.  Neck: Normal range of motion and full passive range of motion without pain. Neck supple.  Cardiovascular: Normal rate, regular rhythm and normal heart sounds.  Exam reveals no gallop and no friction rub.   No murmur heard. Pulmonary/Chest: Effort normal. No respiratory distress. She has decreased breath sounds. She has no wheezes. She has no rhonchi. She has no rales. She exhibits no tenderness and no crepitus.  Coughing frequently  Abdominal: Soft. Normal appearance and bowel sounds are normal. She exhibits no distension. There is no tenderness. There is no rebound and no guarding.  Musculoskeletal: Normal range of motion. She exhibits no edema and no tenderness.  Moves all extremities well.   Neurological: She is alert and oriented to person, place, and time. She  has normal strength. No cranial nerve deficit.  Skin: Skin is warm, dry and intact. No rash noted. No erythema. No pallor.  Psychiatric: She has a normal mood and affect. Her speech is normal and behavior is normal. Her mood appears not anxious.    ED Course  Procedures (including critical care time)  Medications  AEROCHAMBER PLUS FLO-VU MEDIUM device MISC 1 each (not administered)  albuterol (PROVENTIL HFA;VENTOLIN HFA) 108 (90 BASE) MCG/ACT inhaler 2 puff (not administered)  albuterol (PROVENTIL) (5 MG/ML) 0.5% nebulizer solution 5 mg (5 mg Nebulization Given 06/15/12 0926)  ipratropium (ATROVENT) nebulizer solution 0.5 mg (0.5 mg Nebulization Given 06/15/12 0926)  ALPRAZolam (XANAX) tablet 0.5 mg (0.5 mg Oral Given 06/15/12 1023)   Recheck after first nebulizer treatment. Patient states she's feeling better. Her cough is markedly diminished. She has improved air movement without rhonchi or wheezing. We discussed at this point antibiotics are not indicated she most likely has a viral illness. She will be discharged with an inhaler and steroids.PT requesting sleeping pills, advised now that her cough is better, she should sleep better (she was asleep when I entered her room for recheck).   Results for orders placed during the hospital encounter of 06/15/12  CBC      Result Value Range   WBC 11.2 (*) 4.0 - 10.5 K/uL   RBC 4.48  3.87 - 5.11 MIL/uL   Hemoglobin 12.9  12.0 - 15.0 g/dL   HCT 16.1  09.6 - 04.5 %   MCV 87.5  78.0 - 100.0 fL   MCH 28.8  26.0 - 34.0 pg   MCHC 32.9  30.0 - 36.0 g/dL   RDW 40.9  81.1 - 91.4 %   Platelets 374  150 - 400 K/uL  BASIC METABOLIC PANEL      Result Value Range   Sodium 137  135 - 145 mEq/L   Potassium 4.4  3.5 - 5.1 mEq/L   Chloride 104  96 - 112 mEq/L   CO2 21  19 - 32 mEq/L   Glucose, Bld 114 (*) 70 - 99 mg/dL   BUN 8  6 - 23 mg/dL   Creatinine, Ser 7.82  0.50 - 1.10 mg/dL   Calcium 9.4  8.4 - 95.6 mg/dL   GFR calc non Af Amer >90  >90 mL/min    GFR calc Af Amer >90  >90 mL/min  POCT I-STAT TROPONIN I      Result Value Range   Troponin i, poc 0.00  0.00 - 0.08 ng/mL   Comment 3            Dg Chest 2 View  06/15/2012   *  RADIOLOGY REPORT*  Clinical Data: Shortness of breath with chest pain.  CHEST - 2 VIEW  Comparison: None.  Findings: Normal heart size with clear lung fields.  Mild peribronchial thickening, question chronic bronchitis.  No effusion or pneumothorax.  Mild calcification of aortic knob.  No acute osseous findings.  IMPRESSION: Mild peribronchial thickening, question chronic bronchitis.  No active cardiopulmonary disease.   Original Report Authenticated By: Davonna Belling, M.D.     Date: 06/15/2012  Rate: 103  Rhythm: sinus tachycardia  QRS Axis: normal  Intervals: normal  ST/T Wave abnormalities: normal  Conduction Disutrbances:none  Narrative Interpretation:   Old EKG Reviewed: none available    1. Bronchitis, acute    New Prescriptions   DEXTROMETHORPHAN-GUAIFENESIN (MUCINEX DM) 30-600 MG TB12    Take 1 tablet by mouth 2 (two) times daily as needed.   PREDNISONE (DELTASONE) 20 MG TABLET    Take 3 po QD x 3d , then 2 po QD x 3d then 1 po QD x 3d    Plan discharge  Devoria Albe, MD, FACEP    MDM          Ward Givens, MD 06/15/12 1126

## 2012-06-15 NOTE — ED Notes (Signed)
Pt presenting to ed with c/o chest tightness, cough and shortness of breath x 2 days pt state nausea no vomiting

## 2013-11-08 ENCOUNTER — Encounter (HOSPITAL_COMMUNITY): Payer: Self-pay | Admitting: Emergency Medicine

## 2015-01-27 ENCOUNTER — Encounter (HOSPITAL_COMMUNITY): Payer: Self-pay | Admitting: *Deleted

## 2015-01-27 ENCOUNTER — Emergency Department (HOSPITAL_COMMUNITY)
Admission: EM | Admit: 2015-01-27 | Discharge: 2015-01-28 | Disposition: A | Discharge status: Home / Self Care | Payer: No Typology Code available for payment source | Attending: Emergency Medicine | Admitting: Emergency Medicine

## 2015-01-27 DIAGNOSIS — F17210 Nicotine dependence, cigarettes, uncomplicated: Secondary | ICD-10-CM | POA: Insufficient documentation

## 2015-01-27 DIAGNOSIS — Y9289 Other specified places as the place of occurrence of the external cause: Secondary | ICD-10-CM | POA: Insufficient documentation

## 2015-01-27 DIAGNOSIS — S3992XA Unspecified injury of lower back, initial encounter: Secondary | ICD-10-CM | POA: Insufficient documentation

## 2015-01-27 DIAGNOSIS — Y998 Other external cause status: Secondary | ICD-10-CM | POA: Insufficient documentation

## 2015-01-27 DIAGNOSIS — Y9301 Activity, walking, marching and hiking: Secondary | ICD-10-CM | POA: Insufficient documentation

## 2015-01-27 DIAGNOSIS — W01198A Fall on same level from slipping, tripping and stumbling with subsequent striking against other object, initial encounter: Secondary | ICD-10-CM | POA: Insufficient documentation

## 2015-01-27 DIAGNOSIS — T148 Other injury of unspecified body region: Secondary | ICD-10-CM | POA: Insufficient documentation

## 2015-01-27 DIAGNOSIS — T148XXA Other injury of unspecified body region, initial encounter: Secondary | ICD-10-CM

## 2015-01-27 DIAGNOSIS — F909 Attention-deficit hyperactivity disorder, unspecified type: Secondary | ICD-10-CM | POA: Insufficient documentation

## 2015-01-27 DIAGNOSIS — Z79899 Other long term (current) drug therapy: Secondary | ICD-10-CM | POA: Insufficient documentation

## 2015-01-27 DIAGNOSIS — S29001A Unspecified injury of muscle and tendon of front wall of thorax, initial encounter: Secondary | ICD-10-CM | POA: Insufficient documentation

## 2015-01-27 DIAGNOSIS — Z880 Allergy status to penicillin: Secondary | ICD-10-CM | POA: Insufficient documentation

## 2015-01-27 DIAGNOSIS — S199XXA Unspecified injury of neck, initial encounter: Secondary | ICD-10-CM | POA: Insufficient documentation

## 2015-01-27 DIAGNOSIS — G43809 Other migraine, not intractable, without status migrainosus: Secondary | ICD-10-CM | POA: Insufficient documentation

## 2015-01-27 DIAGNOSIS — S0990XA Unspecified injury of head, initial encounter: Secondary | ICD-10-CM | POA: Insufficient documentation

## 2015-01-27 MED ORDER — KETOROLAC TROMETHAMINE 60 MG/2ML IM SOLN
60.0000 mg | Freq: Once | INTRAMUSCULAR | Status: AC
  Administered 2015-01-28: 60 mg via INTRAMUSCULAR
  Filled 2015-01-27: qty 2

## 2015-01-27 MED ORDER — HYDROCODONE-ACETAMINOPHEN 5-325 MG PO TABS: 1.0000 | ORAL_TABLET | ORAL | Status: DC | PRN

## 2015-01-27 MED ORDER — METOCLOPRAMIDE HCL 5 MG/ML IJ SOLN
10.0000 mg | Freq: Once | INTRAMUSCULAR | Status: AC
  Administered 2015-01-28: 10 mg via INTRAMUSCULAR
  Filled 2015-01-27: qty 2

## 2015-01-27 MED ORDER — DIPHENHYDRAMINE HCL 25 MG PO CAPS
25.0000 mg | ORAL_CAPSULE | Freq: Once | ORAL | Status: AC
  Administered 2015-01-28: 25 mg via ORAL
  Filled 2015-01-27: qty 1

## 2015-01-27 NOTE — ED Provider Notes (Signed)
CSN: [id number]     Arrival date & time 01/27/15  2015 History  By signing my name below, I, Emmanuella Mensah, attest that this documentation has been prepared under the direction and in the presence of Elpidio Anis, PA-C. Electronically Signed: Angelene Giovanni, ED Scribe. 01/27/2015. 11:54 PM.    Chief Complaint  Patient presents with  . Fall  . Migraine  . Back Pain  . Neck Pain   The history is provided by the patient. No language interpreter was used.   HPI Comments: Shannon Farley is a 54 y.o. female who presents to the Emergency Department complaining of gradually worsening moderate back pain that intermittently radiates down her left leg and neck pain s/p fall that occurred today at 9:30 am. She reports onset of headache through the day c/w her history of migraines, and photophobia. She explains that she was walking outside today while it was raining and she slipped and fell on her left side, hitting the left side of her head. She denies any LOC or head injuries. She was able to walk after the fall. She states that she took Excedrin and Tramadol prescribed by her PCP. She denies any n/v, chest pain, abdominal pain, or SOB.    Past Medical History  Diagnosis Date  . Migraines   . ADHD (attention deficit hyperactivity disorder)    Past Surgical History  Procedure Laterality Date  . Tubal ligation      39yrs ago   Family History  Problem Relation Age of Onset  . Cancer Mother 78    pancreatic cancer  . Cancer Father 42    lung cancer   Social History  Substance Use Topics  . Smoking status: Current Every Day Smoker -- 0.50 packs/day for 15 years    Types: Cigarettes  . Smokeless tobacco: Never Used  . Alcohol Use: No     Comment: occassionally   OB History    Gravida Para Term Preterm AB TAB SAB Ectopic Multiple Living   Review of Systems  Constitutional: Negative for fever and chills.  Eyes: Negative for visual disturbance.  Respiratory:  Negative for shortness of breath.   Gastrointestinal: Negative for nausea, vomiting and abdominal pain.  Musculoskeletal: Positive for back pain and neck pain.  Skin: Negative for wound.  Neurological: Positive for headaches.      Allergies  Penicillins  Home Medications   Prior to Admission medications   Medication Sig Start Date End Date Taking? Authorizing Provider  ALPRAZolam Prudy Feeler) 0.5 MG tablet Take 0.5 mg by mouth 2 (two) times daily.    Yes Historical Provider, MD  amphetamine-dextroamphetamine (ADDERALL) 30 MG tablet Take 30 mg by mouth 2 (two) times daily.   Yes Historical Provider, MD  aspirin-acetaminophen-caffeine (EXCEDRIN MIGRAINE) [phone] MG per tablet Take 2 tablets by mouth every 6 (six) hours as needed. headache   Yes Historical Provider, MD  traMADol (ULTRAM) 50 MG tablet Take 50 mg by mouth every 6 (six) hours as needed (headaches).   Yes Historical Provider, MD  Dextromethorphan-Guaifenesin (MUCINEX DM) 30-600 MG TB12 Take 1 tablet by mouth 2 (two) times daily as needed. Patient not taking: Reported on 01/27/2015 06/15/12   Devoria Albe, MD  predniSONE (DELTASONE) 20 MG tablet Take 3 po QD x 3d , then 2 po QD x 3d then 1 po QD x 3d Patient not taking: Reported on 01/27/2015 06/15/12   Devoria Albe, MD  BP 133/89 mmHg  Pulse 99  Temp(Src) 97.4 F (36.3 C) (Oral)  Resp 18  SpO2 98% Physical Exam  Constitutional: She is oriented to person, place, and time. She appears well-developed and well-nourished. No distress.  HENT:  Head: Normocephalic and atraumatic.  Eyes: Conjunctivae are normal. Pupils are equal, round, and reactive to light.  Neck: Normal range of motion. Neck supple.  Cardiovascular: Normal rate.   Pulmonary/Chest: Effort normal.  Abdominal: Soft. She exhibits no distension. There is no tenderness.  Musculoskeletal: Normal range of motion.  There is paracervical, para-thoracic and paralumbar tenderness without midline tenderness. FROM all extremities.  Goes from sitting to standing to ambulatory without difficulty or limitation. No tenderness or gross bony abnormalities of extremities.   Neurological: She is alert and oriented to person, place, and time.  CN's 3-12 grossly intact. Speech clear and focused. Normal memory and cognition. No deficits of coordination.  Skin: Skin is warm and dry.  Psychiatric: She has a normal mood and affect.  Nursing note and vitals reviewed.   ED Course  Procedures (including critical care time) DIAGNOSTIC STUDIES: Oxygen Saturation is 98% on RA, normal by my interpretation.    COORDINATION OF CARE: 11:48 PM- Pt advised of plan for treatment and pt agrees. Pt will receive Toradol, Benadryl and Reglan.   MDM   Final diagnoses:  None    1. Muscle strain 2. Migraine headache  No neurologic deficits, visualized head trauma, LOC or subsequent vomiting. Doubt intracranial head injury. The patient reports migraine headache after event that is consistent with her previous migraine headaches. Will treat with headache cocktail in the ED  Neck, back and LE pain c/w musculoskeletal strain injury without concern for fracture.   I personally performed the services described in this documentation, which was scribed in my presence. The recorded information has been reviewed and is accurate.     Elpidio Anis, PA-C 01/28/15 0111  [name], MD 01/28/15 [phone]
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
## 2015-01-27 NOTE — Discharge Instructions (Signed)
Migraine Headache A migraine headache is an intense, throbbing pain on one or both sides of your head. A migraine can last for 30 minutes to several hours. CAUSES  The exact cause of a migraine headache is not always known. However, a migraine may be caused when nerves in the brain become irritated and release chemicals that cause inflammation. This causes pain. Certain things may also trigger migraines, such as:  Alcohol.  Smoking.  Stress.  Menstruation.  Aged cheeses.  Foods or drinks that contain nitrates, glutamate, aspartame, or tyramine.  Lack of sleep.  Chocolate.  Caffeine.  Hunger.  Physical exertion.  Fatigue.  Medicines used to treat chest pain (nitroglycerine), birth control pills, estrogen, and some blood pressure medicines. SIGNS AND SYMPTOMS  Pain on one or both sides of your head.  Pulsating or throbbing pain.  Severe pain that prevents daily activities.  Pain that is aggravated by any physical activity.  Nausea, vomiting, or both.  Dizziness.  Pain with exposure to bright lights, loud noises, or activity.  General sensitivity to bright lights, loud noises, or smells. Before you get a migraine, you may get warning signs that a migraine is coming (aura). An aura may include:  Seeing flashing lights.  Seeing bright spots, halos, or zigzag lines.  Having tunnel vision or blurred vision.  Having feelings of numbness or tingling.  Having trouble talking.  Having muscle weakness. DIAGNOSIS  A migraine headache is often diagnosed based on:  Symptoms.  Physical exam.  A CT scan or MRI of your head. These imaging tests cannot diagnose migraines, but they can help rule out other causes of headaches. TREATMENT Medicines may be given for pain and nausea. Medicines can also be given to help prevent recurrent migraines.  HOME CARE INSTRUCTIONS  Only take over-the-counter or prescription medicines for pain or discomfort as directed by your  health care provider. The use of long-term narcotics is not recommended.  Lie down in a dark, quiet room when you have a migraine.  Keep a journal to find out what may trigger your migraine headaches. For example, write down:  What you eat and drink.  How much sleep you get.  Any change to your diet or medicines.  Limit alcohol consumption.  Quit smoking if you smoke.  Get 7-9 hours of sleep, or as recommended by your health care provider.  Limit stress.  Keep lights dim if bright lights bother you and make your migraines worse. SEEK IMMEDIATE MEDICAL CARE IF:   Your migraine becomes severe.  You have a fever.  You have a stiff neck.  You have vision loss.  You have muscular weakness or loss of muscle control.  You start losing your balance or have trouble walking.  You feel faint or pass out.  You have severe symptoms that are different from your first symptoms. MAKE SURE YOU:   Understand these instructions.  Will watch your condition.  Will get help right away if you are not doing well or get worse.   This information is not intended to replace advice given to you by your health care provider. Make sure you discuss any questions you have with your health care provider.   Document Released: 12/24/2004 Document Revised: 01/14/2014 Document Reviewed: 08/31/2012 Elsevier Interactive Patient Education 2016 Elsevier Inc[employer] Therapy Heat therapy can help ease sore, stiff, injured, and tight muscles and joints. Heat relaxes your muscles, which may help ease your pain.  RISKS AND COMPLICATIONS If you have any of the  following conditions, do not use heat therapy unless your health care provider has approved:  Poor circulation.  Healing wounds or scarred skin in the area being treated.  Diabetes, heart disease, or high blood pressure.  Not being able to feel (numbness) the area being treated.  Unusual swelling of the area being treated.  Active  infections.  Blood clots.  Cancer.  Inability to communicate pain. This may include young children and people who have problems with their brain function (dementia).  Pregnancy. Heat therapy should only be used on old, pre-existing, or long-lasting (chronic) injuries. Do not use heat therapy on new injuries unless directed by your health care provider. HOW TO USE HEAT THERAPY There are several different kinds of heat therapy, including:  Moist heat pack.  Warm water bath.  Hot water bottle.  Electric heating pad.  Heated gel pack.  Heated wrap.  Electric heating pad. Use the heat therapy method suggested by your health care provider. Follow your health care provider's instructions on when and how to use heat therapy. GENERAL HEAT THERAPY RECOMMENDATIONS  Do not sleep while using heat therapy. Only use heat therapy while you are awake.  Your skin may turn pink while using heat therapy. Do not use heat therapy if your skin turns red.  Do not use heat therapy if you have new pain.  High heat or long exposure to heat can cause burns. Be careful when using heat therapy to avoid burning your skin.  Do not use heat therapy on areas of your skin that are already irritated, such as with a rash or sunburn. SEEK MEDICAL CARE IF:  You have blisters, redness, swelling, or numbness.  You have new pain.  Your pain is worse. MAKE SURE YOU:  Understand these instructions.  Will watch your condition.  Will get help right away if you are not doing well or get worse.   This information is not intended to replace advice given to you by your health care provider. Make sure you discuss any questions you have with your health care provider.   Document Released: 03/18/2011 Document Revised: 01/14/2014 Document Reviewed: 02/16/2013 Elsevier Interactive Patient Education 2016 Elsevier Inc. Muscle Strain A muscle strain is an injury that occurs when a muscle is stretched beyond its  normal length. Usually a small number of muscle fibers are torn when this happens. Muscle strain is rated in degrees. First-degree strains have the least amount of muscle fiber tearing and pain. Second-degree and third-degree strains have increasingly more tearing and pain.  Usually, recovery from muscle strain takes 1-2 weeks. Complete healing takes 5-6 weeks.  CAUSES  Muscle strain happens when a sudden, violent force placed on a muscle stretches it too far. This may occur with lifting, sports, or a fall.  RISK FACTORS Muscle strain is especially common in athletes.  SIGNS AND SYMPTOMS At the site of the muscle strain, there may be:  Pain.  Bruising.  Swelling.  Difficulty using the muscle due to pain or lack of normal function. DIAGNOSIS  Your health care provider will perform a physical exam and ask about your medical history. TREATMENT  Often, the best treatment for a muscle strain is resting, icing, and applying cold compresses to the injured area.  HOME CARE INSTRUCTIONS   Use the PRICE method of treatment to promote muscle healing during the first 2-3 days after your injury. The PRICE method involves:  Protecting the muscle from being injured again.  Restricting your activity and resting the injured  body part.  Icing your injury. To do this, put ice in a plastic bag. Place a towel between your skin and the bag. Then, apply the ice and leave it on from 15-20 minutes each hour. After the third day, switch to moist heat packs.  Apply compression to the injured area with a splint or elastic bandage. Be careful not to wrap it too tightly. This may interfere with blood circulation or increase swelling.  Elevate the injured body part above the level of your heart as often as you can.  Only take over-the-counter or prescription medicines for pain, discomfort, or fever as directed by your health care provider.  Warming up prior to exercise helps to prevent future muscle  strains. SEEK MEDICAL CARE IF:   You have increasing pain or swelling in the injured area.  You have numbness, tingling, or a significant loss of strength in the injured area. MAKE SURE YOU:   Understand these instructions.  Will watch your condition.  Will get help right away if you are not doing well or get worse.   This information is not intended to replace advice given to you by your health care provider. Make sure you discuss any questions you have with your health care provider.   Document Released: 12/24/2004 Document Revised: 10/14/2012 Document Reviewed: 07/23/2012 Elsevier Interactive Patient Education [employer]

## 2015-03-07 ENCOUNTER — Encounter (HOSPITAL_COMMUNITY): Payer: Self-pay | Admitting: Emergency Medicine

## 2015-03-07 ENCOUNTER — Emergency Department (HOSPITAL_COMMUNITY)
Admission: EM | Admit: 2015-03-07 | Discharge: 2015-03-07 | Disposition: A | Discharge status: Home / Self Care | Payer: Self-pay | Attending: Emergency Medicine | Admitting: Emergency Medicine

## 2015-03-07 DIAGNOSIS — M79669 Pain in unspecified lower leg: Secondary | ICD-10-CM | POA: Insufficient documentation

## 2015-03-07 DIAGNOSIS — M549 Dorsalgia, unspecified: Secondary | ICD-10-CM | POA: Insufficient documentation

## 2015-03-07 DIAGNOSIS — F17210 Nicotine dependence, cigarettes, uncomplicated: Secondary | ICD-10-CM | POA: Insufficient documentation

## 2015-03-07 NOTE — ED Notes (Signed)
Called to treatment area twice -no response
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
## 2015-03-07 NOTE — ED Notes (Signed)
Called to treatment room-- no response

## 2015-03-16 ENCOUNTER — Emergency Department (HOSPITAL_COMMUNITY): Payer: No Typology Code available for payment source

## 2015-03-16 ENCOUNTER — Encounter (HOSPITAL_COMMUNITY): Payer: Self-pay

## 2015-03-16 ENCOUNTER — Emergency Department (HOSPITAL_COMMUNITY)
Admission: EM | Admit: 2015-03-16 | Discharge: 2015-03-16 | Disposition: A | Discharge status: Home / Self Care | Payer: No Typology Code available for payment source | Attending: Emergency Medicine | Admitting: Emergency Medicine

## 2015-03-16 DIAGNOSIS — Y92410 Unspecified street and highway as the place of occurrence of the external cause: Secondary | ICD-10-CM | POA: Insufficient documentation

## 2015-03-16 DIAGNOSIS — Z79899 Other long term (current) drug therapy: Secondary | ICD-10-CM | POA: Diagnosis not present

## 2015-03-16 DIAGNOSIS — F17210 Nicotine dependence, cigarettes, uncomplicated: Secondary | ICD-10-CM | POA: Insufficient documentation

## 2015-03-16 DIAGNOSIS — S199XXA Unspecified injury of neck, initial encounter: Secondary | ICD-10-CM | POA: Diagnosis present

## 2015-03-16 DIAGNOSIS — Z8659 Personal history of other mental and behavioral disorders: Secondary | ICD-10-CM | POA: Insufficient documentation

## 2015-03-16 DIAGNOSIS — Y9389 Activity, other specified: Secondary | ICD-10-CM | POA: Diagnosis not present

## 2015-03-16 DIAGNOSIS — Z880 Allergy status to penicillin: Secondary | ICD-10-CM | POA: Insufficient documentation

## 2015-03-16 DIAGNOSIS — S39012A Strain of muscle, fascia and tendon of lower back, initial encounter: Secondary | ICD-10-CM

## 2015-03-16 DIAGNOSIS — S161XXA Strain of muscle, fascia and tendon at neck level, initial encounter: Secondary | ICD-10-CM | POA: Diagnosis not present

## 2015-03-16 DIAGNOSIS — S0993XA Unspecified injury of face, initial encounter: Secondary | ICD-10-CM | POA: Diagnosis not present

## 2015-03-16 DIAGNOSIS — Y998 Other external cause status: Secondary | ICD-10-CM | POA: Diagnosis not present

## 2015-03-16 DIAGNOSIS — Z8679 Personal history of other diseases of the circulatory system: Secondary | ICD-10-CM | POA: Diagnosis not present

## 2015-03-16 DIAGNOSIS — S4992XA Unspecified injury of left shoulder and upper arm, initial encounter: Secondary | ICD-10-CM | POA: Insufficient documentation

## 2015-03-16 MED ORDER — IBUPROFEN 800 MG PO TABS: 800.0000 mg | ORAL_TABLET | Freq: Three times a day (TID) | ORAL | Status: AC | PRN

## 2015-03-16 MED ORDER — HYDROCODONE-ACETAMINOPHEN 5-325 MG PO TABS: 1.0000 | ORAL_TABLET | Freq: Four times a day (QID) | ORAL | Status: DC | PRN

## 2015-03-16 MED ORDER — OXYCODONE-ACETAMINOPHEN 5-325 MG PO TABS
1.0000 | ORAL_TABLET | Freq: Once | ORAL | Status: AC
  Administered 2015-03-16: 1 via ORAL
  Filled 2015-03-16: qty 1
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
## 2015-03-16 NOTE — ED Notes (Signed)
RN  DC's Pt because PA had placed DC papers at RNs workstation.  RN did not realize that imaging was not resulted for this Pt.  RN notified new PA (following shift change) Harvin HazelKelli, Charge RN and Dr. Madilyn Hookees.  RN attempted to reach Pt at the listed phone number on chart [phone] two times with the message reporting that "no new calls are being accepted at this time".

## 2015-03-16 NOTE — Discharge Instructions (Signed)
Return here as needed. Follow up with the dentist provided. °
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
## 2015-03-16 NOTE — ED Notes (Signed)
ggg
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
## 2015-03-16 NOTE — ED Notes (Signed)
patient

## 2015-04-22 ENCOUNTER — Encounter (HOSPITAL_BASED_OUTPATIENT_CLINIC_OR_DEPARTMENT_OTHER): Payer: Self-pay | Admitting: Emergency Medicine

## 2015-04-22 ENCOUNTER — Emergency Department (HOSPITAL_BASED_OUTPATIENT_CLINIC_OR_DEPARTMENT_OTHER)
Admission: EM | Admit: 2015-04-22 | Discharge: 2015-04-22 | Disposition: A | Discharge status: Home / Self Care | Payer: No Typology Code available for payment source | Attending: Emergency Medicine | Admitting: Emergency Medicine

## 2015-04-22 DIAGNOSIS — G43909 Migraine, unspecified, not intractable, without status migrainosus: Secondary | ICD-10-CM | POA: Insufficient documentation

## 2015-04-22 DIAGNOSIS — F909 Attention-deficit hyperactivity disorder, unspecified type: Secondary | ICD-10-CM | POA: Insufficient documentation

## 2015-04-22 DIAGNOSIS — Z880 Allergy status to penicillin: Secondary | ICD-10-CM | POA: Insufficient documentation

## 2015-04-22 DIAGNOSIS — F17210 Nicotine dependence, cigarettes, uncomplicated: Secondary | ICD-10-CM | POA: Insufficient documentation

## 2015-04-22 DIAGNOSIS — G478 Other sleep disorders: Secondary | ICD-10-CM | POA: Insufficient documentation

## 2015-04-22 DIAGNOSIS — Z760 Encounter for issue of repeat prescription: Secondary | ICD-10-CM

## 2015-04-22 DIAGNOSIS — Z79899 Other long term (current) drug therapy: Secondary | ICD-10-CM | POA: Insufficient documentation

## 2015-04-22 NOTE — Discharge Instructions (Signed)
You were seen and evaluated today regarding being out of your chronic medications. Unfortunately we are not able to refill them from the emergency department. I have included referrals to 3 different outpatient primary care offices. Call and discuss getting refills with your primary care physician on Monday or make an appointment to follow up at one of these clinics.

## 2015-04-22 NOTE — ED Notes (Signed)
Patient upset that MD would  not prescribe xanax and adderal. Explained to patient the need to f/u with primary MD to manage prescriptions

## 2015-04-22 NOTE — ED Provider Notes (Signed)
CSN: [id number]     Arrival date & time 04/22/15  1439 History  By signing my name below, I, Shannon Farley, attest that this documentation has been prepared under the direction and in the presence of [name], MD. Electronically Signed: Bethel BornBritney Farley, ED Scribe. 04/22/2015. 5:09 PM    Chief Complaint  Patient presents with  . Medication Refill   The history is provided by the patient. No language interpreter was used.   Shannon Farley is a 55 y.o. female with history of ADHD who presents to the Emergency Department for a medication refill. Pt states that she is out of Adderrall, Xanax, and tramadol and was unable to get a prescription from [place] doctor, in [place], because she called too late in the day. She states that he referred her to Urgent Care for a prescription refill.  Associated symptoms include decreased concentration and insomnia.  Past Medical History  Diagnosis Date  . Migraines   . ADHD (attention deficit hyperactivity disorder)    Past Surgical History  Procedure Laterality Date  . Tubal ligation      [age]yrs ago   Family History  Problem Relation Age of Onset  . Cancer Mother 2653    pancreatic cancer  . Cancer Father 5749    lung cancer   Social History  Substance Use Topics  . Smoking status: Current Some Day Smoker -- 0.50 packs/day for 15 years    Types: Cigarettes  . Smokeless tobacco: Never Used  . Alcohol Use: No     Comment: occassionally   OB History    Gravida Para Term Preterm AB TAB SAB Ectopic Multiple Living   2 2 2       2      Review of Systems  Psychiatric/Behavioral: Positive for sleep disturbance and decreased concentration.  All other systems reviewed and are negative.   Allergies  Penicillins  Home Medications   Prior to Admission medications   Medication Sig Start Date End Date Taking? Authorizing Provider  ALPRAZolam Prudy Feeler(XANAX) 0.5 MG tablet Take 0.5 mg by mouth 2 (two) times daily.     Historical Provider, MD   amphetamine-dextroamphetamine (ADDERALL) 30 MG tablet Take 30 mg by mouth 2 (two) times daily.    Historical Provider, MD  aspirin-acetaminophen-caffeine (EXCEDRIN MIGRAINE) [phone] MG per tablet Take 2 tablets by mouth every 6 (six) hours as needed. headache    Historical Provider, MD  Dextromethorphan-Guaifenesin (MUCINEX DM) 30-600 MG TB12 Take 1 tablet by mouth 2 (two) times daily as needed. Patient not taking: Reported on 01/27/2015 06/15/12   Shannon AlbeIva Knapp, MD  HYDROcodone-acetaminophen (NORCO/VICODIN) 5-325 MG tablet Take 1 tablet by mouth every 6 (six) hours as needed for moderate pain. 03/16/15   [name], PA-C  ibuprofen (ADVIL,MOTRIN) 800 MG tablet Take 1 tablet (800 mg total) by mouth every 8 (eight) hours as needed. 03/16/15   [name], PA-C  predniSONE (DELTASONE) 20 MG tablet Take 3 po QD x 3d , then 2 po QD x 3d then 1 po QD x 3d Patient not taking: Reported on 01/27/2015 06/15/12   Shannon AlbeIva Knapp, MD  traMADol (ULTRAM) 50 MG tablet Take 50 mg by mouth every 6 (six) hours as needed (headaches).    Historical Provider, MD   There were no vitals taken for this visit. Physical Exam  Constitutional: She is oriented to person, place, and time. She appears well-developed and well-nourished.  HENT:  Head: Normocephalic.  Eyes: EOM are normal.  Neck: Normal range of  motion.  Pulmonary/Chest: Effort normal.  Abdominal: She exhibits no distension.  Musculoskeletal: Normal range of motion.  Neurological: She is alert and oriented to person, place, and time.  Psychiatric: She has a normal mood and affect.  Nursing note and vitals reviewed.   ED Course  Procedures COORDINATION OF CARE: 5:09 PM Discussed treatment plan which includes referral for outpatient medication management with pt at bedside and pt agreed to plan.  Labs Review Labs Reviewed - No data to display  Imaging Review No results found.   EKG Interpretation None      MDM  Patient was seen and evaluated  in stable condition and she was informed that we are not able to provide prescriptions for her chronic controlled substances through the emergency department. She was instructed to follow-up outpatient with the primary care physician for such refills. The patient was not happy with this but did express understanding. Final diagnoses:  Medication refill    1. Medication refill request  I personally performed the services described in this documentation, which was scribed in my presence. The recorded information has been reviewed and is accurate.   Leta Baptist, MD 04/23/15 [phone]

## 2015-04-22 NOTE — ED Notes (Signed)
Pt requesting prescription refill medications for adderall, xanax and tramadol.

## 2015-06-10 ENCOUNTER — Emergency Department (HOSPITAL_COMMUNITY)
Admission: EM | Admit: 2015-06-10 | Discharge: 2015-06-10 | Disposition: A | Discharge status: Home / Self Care | Payer: No Typology Code available for payment source | Attending: Emergency Medicine | Admitting: Emergency Medicine

## 2015-06-10 ENCOUNTER — Encounter (HOSPITAL_COMMUNITY): Payer: Self-pay

## 2015-06-10 DIAGNOSIS — Z79899 Other long term (current) drug therapy: Secondary | ICD-10-CM | POA: Insufficient documentation

## 2015-06-10 DIAGNOSIS — M79604 Pain in right leg: Secondary | ICD-10-CM | POA: Insufficient documentation

## 2015-06-10 DIAGNOSIS — R21 Rash and other nonspecific skin eruption: Secondary | ICD-10-CM | POA: Diagnosis not present

## 2015-06-10 DIAGNOSIS — F17210 Nicotine dependence, cigarettes, uncomplicated: Secondary | ICD-10-CM | POA: Insufficient documentation

## 2015-06-10 DIAGNOSIS — M79606 Pain in leg, unspecified: Secondary | ICD-10-CM

## 2015-06-10 DIAGNOSIS — M79605 Pain in left leg: Secondary | ICD-10-CM | POA: Insufficient documentation

## 2015-06-10 DIAGNOSIS — Z760 Encounter for issue of repeat prescription: Secondary | ICD-10-CM | POA: Diagnosis present

## 2015-06-10 MED ORDER — PREDNISONE 10 MG PO TABS: 10.0000 mg | ORAL_TABLET | Freq: Every day | ORAL | Status: AC

## 2015-06-10 MED ORDER — OXYCODONE HCL 5 MG PO TABS
5.0000 mg | ORAL_TABLET | Freq: Once | ORAL | Status: AC
  Administered 2015-06-10: 5 mg via ORAL
  Filled 2015-06-10: qty 1

## 2015-06-10 MED ORDER — PREDNISONE 20 MG PO TABS
10.0000 mg | ORAL_TABLET | Freq: Once | ORAL | Status: AC
  Administered 2015-06-10: 10 mg via ORAL
  Filled 2015-06-10: qty 1

## 2015-06-10 MED ORDER — CLOBETASOL PROPIONATE 0.05 % EX OINT: 1.0000 "application " | TOPICAL_OINTMENT | Freq: Two times a day (BID) | CUTANEOUS | Status: AC

## 2015-06-10 MED ORDER — OXYCODONE HCL 5 MG PO TABS: 5.0000 mg | ORAL_TABLET | Freq: Four times a day (QID) | ORAL | Status: AC | PRN

## 2015-06-10 MED ORDER — GABAPENTIN 300 MG PO CAPS: 300.0000 mg | ORAL_CAPSULE | Freq: Three times a day (TID) | ORAL | Status: AC

## 2015-06-10 MED ORDER — GABAPENTIN 300 MG PO CAPS
300.0000 mg | ORAL_CAPSULE | Freq: Once | ORAL | Status: AC
  Administered 2015-06-10: 300 mg via ORAL
  Filled 2015-06-10: qty 1

## 2015-06-10 MED ORDER — HYDROXYZINE HCL 25 MG PO TABS: 25.0000 mg | ORAL_TABLET | Freq: Three times a day (TID) | ORAL | Status: AC | PRN

## 2015-06-10 NOTE — ED Notes (Signed)
Bandages removed.
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
## 2015-06-10 NOTE — Progress Notes (Signed)
54 y.o. F presents to the ED with [facility], partially completed. Reports that she is staying with her son in [place] and unable/unwilling to [facility] to [facility] refilled at this time or have her supplies replenished. She has marks on her thighs anterior and posterior which she states she uses Vaseline Gauze, kerlix and Ace Wrap tid to dress daily. Wounds do not appear to be acute in nature, yet she reports purulent drainage and other negative consequences if exact regime not followed. Will provide with MATCH except for Narcotics, which she reports she can "purchase out of pocket". Provided pt with Kerlix #20, Ace wraps #10 and vaseline gauze in single packets so she can make it to her follow up appt 06/15/2015 after having Bid dressing changes okayed by Clayborne DanaMesner, MD. Pt appreciative and voiced no questions. CM will sign off at present.
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
## 2015-07-03 ENCOUNTER — Emergency Department (HOSPITAL_BASED_OUTPATIENT_CLINIC_OR_DEPARTMENT_OTHER): Payer: No Typology Code available for payment source

## 2015-07-03 ENCOUNTER — Encounter (HOSPITAL_COMMUNITY): Payer: Self-pay | Admitting: Emergency Medicine

## 2015-07-03 ENCOUNTER — Encounter (HOSPITAL_BASED_OUTPATIENT_CLINIC_OR_DEPARTMENT_OTHER): Payer: Self-pay | Admitting: Emergency Medicine

## 2015-07-03 ENCOUNTER — Emergency Department (HOSPITAL_BASED_OUTPATIENT_CLINIC_OR_DEPARTMENT_OTHER)
Admission: EM | Admit: 2015-07-03 | Discharge: 2015-07-03 | Disposition: A | Discharge status: Home / Self Care | Payer: No Typology Code available for payment source | Attending: Emergency Medicine | Admitting: Emergency Medicine

## 2015-07-03 ENCOUNTER — Emergency Department (HOSPITAL_COMMUNITY)
Admission: EM | Admit: 2015-07-03 | Discharge: 2015-07-03 | Disposition: A | Discharge status: Home / Self Care | Payer: No Typology Code available for payment source | Attending: Dermatology | Admitting: Dermatology

## 2015-07-03 DIAGNOSIS — Z5321 Procedure and treatment not carried out due to patient leaving prior to being seen by health care provider: Secondary | ICD-10-CM | POA: Diagnosis not present

## 2015-07-03 DIAGNOSIS — F17210 Nicotine dependence, cigarettes, uncomplicated: Secondary | ICD-10-CM | POA: Insufficient documentation

## 2015-07-03 DIAGNOSIS — Y999 Unspecified external cause status: Secondary | ICD-10-CM | POA: Insufficient documentation

## 2015-07-03 DIAGNOSIS — S32030A Wedge compression fracture of third lumbar vertebra, initial encounter for closed fracture: Secondary | ICD-10-CM | POA: Diagnosis not present

## 2015-07-03 DIAGNOSIS — K2970 Gastritis, unspecified, without bleeding: Secondary | ICD-10-CM | POA: Diagnosis not present

## 2015-07-03 DIAGNOSIS — R109 Unspecified abdominal pain: Secondary | ICD-10-CM | POA: Insufficient documentation

## 2015-07-03 DIAGNOSIS — M545 Low back pain: Secondary | ICD-10-CM | POA: Diagnosis present

## 2015-07-03 DIAGNOSIS — Y939 Activity, unspecified: Secondary | ICD-10-CM | POA: Diagnosis not present

## 2015-07-03 DIAGNOSIS — R1110 Vomiting, unspecified: Secondary | ICD-10-CM | POA: Insufficient documentation

## 2015-07-03 DIAGNOSIS — Z7982 Long term (current) use of aspirin: Secondary | ICD-10-CM | POA: Diagnosis not present

## 2015-07-03 DIAGNOSIS — S32010A Wedge compression fracture of first lumbar vertebra, initial encounter for closed fracture: Secondary | ICD-10-CM | POA: Insufficient documentation

## 2015-07-03 DIAGNOSIS — Y929 Unspecified place or not applicable: Secondary | ICD-10-CM | POA: Diagnosis not present

## 2015-07-03 DIAGNOSIS — R1013 Epigastric pain: Secondary | ICD-10-CM

## 2015-07-03 DIAGNOSIS — IMO0002 Reserved for concepts with insufficient information to code with codable children: Secondary | ICD-10-CM

## 2015-07-03 LAB — CBC WITH DIFFERENTIAL/PLATELET
BASOS ABS: 0 10*3/uL (ref 0.0–0.1)
Basophils Relative: 0 %
Eosinophils Absolute: 0.1 10*3/uL (ref 0.0–0.7)
Eosinophils Relative: 2 %
HEMATOCRIT: 44.7 % (ref 36.0–46.0)
Hemoglobin: 14.8 g/dL (ref 12.0–15.0)
LYMPHS PCT: 20 %
Lymphs Abs: 1.5 10*3/uL (ref 0.7–4.0)
MCH: 30.4 pg (ref 26.0–34.0)
MCHC: 33.1 g/dL (ref 30.0–36.0)
MCV: 91.8 fL (ref 78.0–100.0)
MONO ABS: 0.7 10*3/uL (ref 0.1–1.0)
Monocytes Relative: 9 %
NEUTROS ABS: 5.2 10*3/uL (ref 1.7–7.7)
Neutrophils Relative %: 69 %
Platelets: 374 10*3/uL (ref 150–400)
RBC: 4.87 MIL/uL (ref 3.87–5.11)
RDW: 13.7 % (ref 11.5–15.5)
WBC: 7.5 10*3/uL (ref 4.0–10.5)

## 2015-07-03 LAB — CBC
HCT: 42.8 % (ref 36.0–46.0)
HEMOGLOBIN: 13.7 g/dL (ref 12.0–15.0)
MCH: 29.3 pg (ref 26.0–34.0)
MCHC: 32 g/dL (ref 30.0–36.0)
MCV: 91.6 fL (ref 78.0–100.0)
PLATELETS: 391 10*3/uL (ref 150–400)
RBC: 4.67 MIL/uL (ref 3.87–5.11)
RDW: 13.7 % (ref 11.5–15.5)
WBC: 11 10*3/uL — ABNORMAL HIGH (ref 4.0–10.5)

## 2015-07-03 LAB — COMPREHENSIVE METABOLIC PANEL
ALBUMIN: 4.1 g/dL (ref 3.5–5.0)
ALK PHOS: 84 U/L (ref 38–126)
ALT: 19 U/L (ref 14–54)
AST: 21 U/L (ref 15–41)
Anion gap: 10 (ref 5–15)
BUN: 7 mg/dL (ref 6–20)
CALCIUM: 10 mg/dL (ref 8.9–10.3)
CHLORIDE: 102 mmol/L (ref 101–111)
CO2: 25 mmol/L (ref 22–32)
CREATININE: 0.76 mg/dL (ref 0.44–1.00)
GFR calc Af Amer: >60 mL/min (ref >60)
GFR calc non Af Amer: >60 mL/min (ref >60)
GLUCOSE: 113 mg/dL — AB (ref 65–99)
Potassium: 3.9 mmol/L (ref 3.5–5.1)
SODIUM: 137 mmol/L (ref 135–145)
Total Bilirubin: 0.5 mg/dL (ref 0.3–1.2)
Total Protein: 7.1 g/dL (ref 6.5–8.1)

## 2015-07-03 LAB — LIPASE, BLOOD: LIPASE: 20 U/L (ref 11–51)

## 2015-07-03 LAB — OCCULT BLOOD X 1 CARD TO LAB, STOOL: Fecal Occult Bld: NEGATIVE

## 2015-07-03 MED ORDER — OXYCODONE-ACETAMINOPHEN 5-325 MG PO TABS
1.0000 | ORAL_TABLET | ORAL | Status: DC | PRN
  Administered 2015-07-03: 1 via ORAL

## 2015-07-03 MED ORDER — PROMETHAZINE HCL 25 MG PO TABS
25.0000 mg | ORAL_TABLET | Freq: Once | ORAL | Status: AC
  Administered 2015-07-03: 25 mg via ORAL
  Filled 2015-07-03: qty 1

## 2015-07-03 MED ORDER — CYCLOBENZAPRINE HCL 10 MG PO TABS
10.0000 mg | ORAL_TABLET | Freq: Once | ORAL | Status: AC
  Administered 2015-07-03: 10 mg via ORAL
  Filled 2015-07-03: qty 1

## 2015-07-03 MED ORDER — OXYCODONE-ACETAMINOPHEN 5-325 MG PO TABS
ORAL_TABLET | ORAL | Status: AC
  Filled 2015-07-03: qty 1

## 2015-07-03 MED ORDER — PANTOPRAZOLE SODIUM 20 MG PO TBEC: 40.0000 mg | DELAYED_RELEASE_TABLET | Freq: Every day | ORAL | Status: AC

## 2015-07-03 MED ORDER — ONDANSETRON 4 MG PO TBDP
4.0000 mg | ORAL_TABLET | Freq: Once | ORAL | Status: AC | PRN
  Administered 2015-07-03: 4 mg via ORAL

## 2015-07-03 MED ORDER — PANTOPRAZOLE SODIUM 40 MG PO TBEC
40.0000 mg | DELAYED_RELEASE_TABLET | Freq: Once | ORAL | Status: AC
  Administered 2015-07-03: 40 mg via ORAL
  Filled 2015-07-03: qty 1

## 2015-07-03 MED ORDER — GI COCKTAIL ~~LOC~~
30.0000 mL | Freq: Once | ORAL | Status: AC
  Administered 2015-07-03: 30 mL via ORAL
  Filled 2015-07-03: qty 30

## 2015-07-03 MED ORDER — ONDANSETRON 4 MG PO TBDP
ORAL_TABLET | ORAL | Status: AC
  Filled 2015-07-03: qty 1

## 2015-07-03 MED ORDER — HYDROCODONE-ACETAMINOPHEN 5-325 MG PO TABS
1.0000 | ORAL_TABLET | Freq: Once | ORAL | Status: AC
Start: 2015-07-03 — End: 2015-07-03
  Administered 2015-07-03: 1 via ORAL
  Filled 2015-07-03: qty 1

## 2015-07-03 MED ORDER — HYDROCODONE-ACETAMINOPHEN 5-325 MG PO TABS
1.0000 | ORAL_TABLET | Freq: Once | ORAL | Status: AC
  Administered 2015-07-03: 1 via ORAL
  Filled 2015-07-03: qty 1

## 2015-07-03 MED ORDER — SODIUM CHLORIDE 0.9 % IV BOLUS (SEPSIS)
1000.0000 mL | Freq: Once | INTRAVENOUS | Status: AC
  Administered 2015-07-03: 1000 mL via INTRAVENOUS

## 2015-07-03 MED ORDER — ONDANSETRON HCL 4 MG/2ML IJ SOLN
4.0000 mg | Freq: Once | INTRAMUSCULAR | Status: AC
  Administered 2015-07-03: 4 mg via INTRAVENOUS
  Filled 2015-07-03: qty 2

## 2015-07-03 MED ORDER — HYDROCODONE-ACETAMINOPHEN 5-325 MG PO TABS: 1.0000 | ORAL_TABLET | Freq: Four times a day (QID) | ORAL | Status: AC | PRN
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
## 2015-07-03 NOTE — ED Notes (Signed)
TLSO not readily available. Dr. Dalene SeltzerSchlossman to call specialist to obtain brace.

## 2015-07-03 NOTE — ED Notes (Signed)
Patient reports that she is having pain to her back and left side and stomach. Patient reports that she is throwing up blood

## 2015-07-03 NOTE — ED Notes (Signed)
Pt waiting for ortho specialist on call for application of the TLSO.

## 2015-07-03 NOTE — ED Notes (Signed)
MD at bedside. 
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
## 2015-07-03 NOTE — ED Notes (Signed)
Patient currently at [facility] for treatement. The patient is sitting in the waiting room here and taken out at [facility] so that this Rn can triage here at [facility]

## 2015-07-03 NOTE — ED Notes (Signed)
Pt teaching provided on medications that may cause drowsiness. Pt instructed not to drive or operate heavy machinery while taking the prescribed medication. Pt verbalized understanding.   

## 2015-07-03 NOTE — ED Notes (Signed)
[employer] representative at bedside to apply TLSO.
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
